# Patient Record
Sex: Male | Born: 1955 | Race: White | Hispanic: No | Marital: Single | Smoking: Current every day smoker
Health system: Southern US, Community
[De-identification: ages and names within clinical notes are randomized; demographics above are authoritative.]

## PROBLEM LIST (undated history)

## (undated) DIAGNOSIS — C641 Malignant neoplasm of right kidney, except renal pelvis: Secondary | ICD-10-CM

## (undated) DIAGNOSIS — R39198 Other difficulties with micturition: Secondary | ICD-10-CM

## (undated) DIAGNOSIS — N529 Male erectile dysfunction, unspecified: Secondary | ICD-10-CM

## (undated) DIAGNOSIS — R319 Hematuria, unspecified: Secondary | ICD-10-CM

## (undated) DIAGNOSIS — R7989 Other specified abnormal findings of blood chemistry: Secondary | ICD-10-CM

## (undated) DIAGNOSIS — I499 Cardiac arrhythmia, unspecified: Secondary | ICD-10-CM

## (undated) DIAGNOSIS — R5381 Other malaise: Secondary | ICD-10-CM

## (undated) DIAGNOSIS — N200 Calculus of kidney: Secondary | ICD-10-CM

## (undated) DIAGNOSIS — K759 Inflammatory liver disease, unspecified: Secondary | ICD-10-CM

## (undated) DIAGNOSIS — N138 Other obstructive and reflux uropathy: Secondary | ICD-10-CM

## (undated) DIAGNOSIS — N289 Disorder of kidney and ureter, unspecified: Secondary | ICD-10-CM

## (undated) DIAGNOSIS — R3912 Poor urinary stream: Secondary | ICD-10-CM

## (undated) DIAGNOSIS — G8929 Other chronic pain: Secondary | ICD-10-CM

## (undated) DIAGNOSIS — K5900 Constipation, unspecified: Secondary | ICD-10-CM

## (undated) DIAGNOSIS — F419 Anxiety disorder, unspecified: Secondary | ICD-10-CM

## (undated) DIAGNOSIS — E039 Hypothyroidism, unspecified: Secondary | ICD-10-CM

## (undated) DIAGNOSIS — M549 Dorsalgia, unspecified: Secondary | ICD-10-CM

## (undated) DIAGNOSIS — IMO0002 Reserved for concepts with insufficient information to code with codable children: Secondary | ICD-10-CM

## (undated) DIAGNOSIS — N401 Enlarged prostate with lower urinary tract symptoms: Secondary | ICD-10-CM

## (undated) DIAGNOSIS — F319 Bipolar disorder, unspecified: Secondary | ICD-10-CM

## (undated) HISTORY — DX: Hypothyroidism, unspecified: E03.9

## (undated) HISTORY — DX: Benign prostatic hyperplasia with lower urinary tract symptoms: N13.8

## (undated) HISTORY — DX: Anxiety disorder, unspecified: F41.9

## (undated) HISTORY — DX: Other specified abnormal findings of blood chemistry: R79.89

## (undated) HISTORY — DX: Dorsalgia, unspecified: M54.9

## (undated) HISTORY — DX: Constipation, unspecified: K59.00

## (undated) HISTORY — DX: Hematuria, unspecified: R31.9

## (undated) HISTORY — DX: Other malaise: R53.81

## (undated) HISTORY — PX: HERNIA REPAIR: SHX51

## (undated) HISTORY — DX: Reserved for concepts with insufficient information to code with codable children: IMO0002

## (undated) HISTORY — DX: Benign prostatic hyperplasia with lower urinary tract symptoms: N40.1

## (undated) HISTORY — PX: NEPHRECTOMY: SHX65

## (undated) HISTORY — DX: Calculus of kidney: N20.0

## (undated) HISTORY — DX: Male erectile dysfunction, unspecified: N52.9

## (undated) HISTORY — DX: Cardiac arrhythmia, unspecified: I49.9

## (undated) HISTORY — DX: Other chronic pain: G89.29

## (undated) HISTORY — DX: Bipolar disorder, unspecified: F31.9

## (undated) HISTORY — PX: TONSILLECTOMY: SUR1361

## (undated) HISTORY — DX: Other difficulties with micturition: R39.198

## (undated) HISTORY — DX: Inflammatory liver disease, unspecified: K75.9

## (undated) HISTORY — DX: Poor urinary stream: R39.12

## (undated) HISTORY — DX: Malignant neoplasm of right kidney, except renal pelvis: C64.1

---

## 2006-12-31 ENCOUNTER — Other Ambulatory Visit: Payer: Self-pay

## 2006-12-31 ENCOUNTER — Ambulatory Visit: Payer: Self-pay

## 2008-06-27 ENCOUNTER — Inpatient Hospital Stay: Payer: Self-pay | Admitting: Psychiatry

## 2008-06-28 ENCOUNTER — Ambulatory Visit: Payer: Self-pay | Admitting: Cardiology

## 2008-08-30 ENCOUNTER — Ambulatory Visit (HOSPITAL_COMMUNITY): Admission: RE | Admit: 2008-08-30 | Discharge: 2008-08-30 | Payer: Self-pay | Admitting: Unknown Physician Specialty

## 2010-01-17 ENCOUNTER — Inpatient Hospital Stay: Payer: Self-pay | Admitting: Internal Medicine

## 2010-01-18 ENCOUNTER — Inpatient Hospital Stay: Payer: Self-pay | Admitting: Psychiatry

## 2010-02-16 ENCOUNTER — Emergency Department: Payer: Self-pay | Admitting: Emergency Medicine

## 2010-02-21 ENCOUNTER — Emergency Department: Payer: Self-pay | Admitting: Emergency Medicine

## 2010-03-20 ENCOUNTER — Ambulatory Visit: Payer: Self-pay | Admitting: Urology

## 2010-04-17 ENCOUNTER — Ambulatory Visit: Payer: Self-pay | Admitting: Urology

## 2010-04-29 ENCOUNTER — Inpatient Hospital Stay: Payer: Self-pay | Admitting: Urology

## 2010-05-01 LAB — PATHOLOGY REPORT

## 2010-05-03 ENCOUNTER — Emergency Department: Payer: Self-pay | Admitting: Emergency Medicine

## 2010-09-02 ENCOUNTER — Ambulatory Visit: Payer: Self-pay | Admitting: Gastroenterology

## 2010-10-14 ENCOUNTER — Ambulatory Visit: Payer: Self-pay | Admitting: Urology

## 2010-11-12 ENCOUNTER — Inpatient Hospital Stay: Payer: Self-pay | Admitting: Psychiatry

## 2011-01-23 ENCOUNTER — Emergency Department: Payer: Self-pay | Admitting: Emergency Medicine

## 2011-05-19 ENCOUNTER — Ambulatory Visit: Payer: Self-pay | Admitting: Pain Medicine

## 2011-07-01 ENCOUNTER — Emergency Department: Payer: Self-pay | Admitting: Emergency Medicine

## 2011-07-03 ENCOUNTER — Ambulatory Visit: Payer: Self-pay | Admitting: Sports Medicine

## 2011-07-25 ENCOUNTER — Ambulatory Visit: Payer: Self-pay | Admitting: Urology

## 2011-09-18 ENCOUNTER — Emergency Department: Payer: Self-pay | Admitting: Emergency Medicine

## 2011-09-18 LAB — DRUG SCREEN, URINE
Amphetamines, Ur Screen: NEGATIVE (ref ?–1000)
Barbiturates, Ur Screen: NEGATIVE (ref ?–200)
Cannabinoid 50 Ng, Ur ~~LOC~~: NEGATIVE (ref ?–50)
Opiate, Ur Screen: NEGATIVE (ref ?–300)
Phencyclidine (PCP) Ur S: NEGATIVE (ref ?–25)

## 2011-09-18 LAB — COMPREHENSIVE METABOLIC PANEL
Albumin: 3.9 g/dL (ref 3.4–5.0)
Alkaline Phosphatase: 93 U/L (ref 50–136)
BUN: 18 mg/dL (ref 7–18)
Bilirubin,Total: 0.7 mg/dL (ref 0.2–1.0)
Calcium, Total: 8.9 mg/dL (ref 8.5–10.1)
Chloride: 107 mmol/L (ref 98–107)
Co2: 32 mmol/L (ref 21–32)
Glucose: 93 mg/dL (ref 65–99)
Osmolality: 283 (ref 275–301)
SGOT(AST): 24 U/L (ref 15–37)
SGPT (ALT): 40 U/L
Sodium: 141 mmol/L (ref 136–145)
Total Protein: 7.4 g/dL (ref 6.4–8.2)

## 2011-09-18 LAB — URINALYSIS, COMPLETE
Bacteria: NONE SEEN
Glucose,UR: NEGATIVE mg/dL (ref 0–75)
Nitrite: NEGATIVE
Ph: 7 (ref 4.5–8.0)
Protein: NEGATIVE
RBC,UR: NONE SEEN /HPF (ref 0–5)
Specific Gravity: 1.005 (ref 1.003–1.030)
Squamous Epithelial: NONE SEEN

## 2011-09-18 LAB — CBC
HCT: 45 % (ref 40.0–52.0)
MCH: 30.1 pg (ref 26.0–34.0)
MCHC: 32.6 g/dL (ref 32.0–36.0)
RBC: 4.87 10*6/uL (ref 4.40–5.90)
RDW: 14.6 % — ABNORMAL HIGH (ref 11.5–14.5)

## 2011-09-18 LAB — TSH: Thyroid Stimulating Horm: 1.23 u[IU]/mL

## 2013-11-24 ENCOUNTER — Ambulatory Visit: Payer: Self-pay | Admitting: Obstetrics and Gynecology

## 2013-12-01 ENCOUNTER — Ambulatory Visit: Payer: Self-pay | Admitting: Obstetrics and Gynecology

## 2013-12-08 ENCOUNTER — Ambulatory Visit: Payer: Self-pay | Admitting: Urology

## 2013-12-08 LAB — CBC
HCT: 47.5 % (ref 40.0–52.0)
HGB: 15.8 g/dL (ref 13.0–18.0)
MCH: 29 pg (ref 26.0–34.0)
MCHC: 33.2 g/dL (ref 32.0–36.0)
MCV: 87 fL (ref 80–100)
Platelet: 244 10*3/uL (ref 150–440)
RBC: 5.45 10*6/uL (ref 4.40–5.90)
RDW: 14.5 % (ref 11.5–14.5)
WBC: 10.1 10*3/uL (ref 3.8–10.6)

## 2013-12-08 LAB — BASIC METABOLIC PANEL
BUN: 20 mg/dL — AB (ref 7–18)
CALCIUM: 9 mg/dL (ref 8.5–10.1)
CHLORIDE: 107 mmol/L (ref 98–107)
CO2: 27 mmol/L (ref 21–32)
Creatinine: 1.8 mg/dL — ABNORMAL HIGH (ref 0.60–1.30)
EGFR (Non-African Amer.): 41 — ABNORMAL LOW
GFR CALC AF AMER: 47 — AB
Glucose: 105 mg/dL — ABNORMAL HIGH (ref 65–99)
Osmolality: 267 (ref 275–301)
Potassium: 4.8 mmol/L (ref 3.5–5.1)
Sodium: 132 mmol/L — ABNORMAL LOW (ref 136–145)

## 2013-12-14 ENCOUNTER — Ambulatory Visit: Payer: Self-pay | Admitting: Urology

## 2014-01-20 ENCOUNTER — Ambulatory Visit: Payer: Self-pay | Admitting: Urology

## 2014-02-22 DIAGNOSIS — R251 Tremor, unspecified: Secondary | ICD-10-CM | POA: Insufficient documentation

## 2014-02-22 DIAGNOSIS — G251 Drug-induced tremor: Secondary | ICD-10-CM | POA: Insufficient documentation

## 2014-05-01 DIAGNOSIS — G479 Sleep disorder, unspecified: Secondary | ICD-10-CM | POA: Insufficient documentation

## 2014-07-15 NOTE — Op Note (Signed)
PATIENT NAME:  Zachary Reynolds, Zachary Reynolds MR#:  [id number] DATE OF BIRTH:  Apr 28, 1955  DATE OF PROCEDURE:  12/14/2013  PREOPERATIVE DIAGNOSIS: Left distal ureteral stone, solitary kidney.   POSTOPERATIVE DIAGNOSIS: Negative ureteroscopy (passed ureteral stone), solitary left kidney.   PROCEDURE PERFORMED: Left retrograde pyelogram, left ureteroscopy, left ureteral stent placement.   INTERPRETATION OF FLUOROSCOPY:  Less than 30 minutes.   ATTENDING SURGEON:  Sherlynn Stalls, MD   ANESTHESIA: General anesthesia.   ESTIMATED BLOOD LOSS: Was minimal.   DRAINS:  A 6 x 26-French double-J ureteral stent on the left. No string.   COMPLICATIONS: None.   INDICATION:  This is a 59 year old male with a solitary left kidney, found to have a 4.8-mm left UVJ stone, which is partially obstructing. He was counseled on his various treatment options and has elected to undergo ureteroscopy, laser lithotripsy, and stent placement. Of note, he did indicate in the preoperative area that he continues to have left lower quadrant pain consistent with a retained stone. Risks and benefits of the procedure explained in detail.  The patient agreed to proceed as planned.   DESCRIPTION OF PROCEDURE:  The patient was correctly identified in the preoperative holding area and informed consent was confirmed. He was brought to the operating suite and placed on the table in the supine position. At this time, a universal timeout protocol was performed. All team members were identified. Venodyne boots were placed, and the patient was administered ciprofloxacin 400 mg in the perioperative period. He was then placed under general anesthesia, repositioned lower on the bed in the dorsal lithotomy position, and prepped and draped in the standard surgical fashion. At this point, a rigid cystoscope was placed per urethra into the bladder using a 22-French access sheath. Attention was turned to the left ureteral orifice, which was cannulated using a  Sensor wire as well as a 5-French open-ended ureteral catheter just within the UO and the wire was removed. At this point, a retrograde pyelogram was performed revealing no evidence of filling defect, a delicate-appearing ureter up to the level of the UPJ without evidence of filling defect or hydronephrosis. Prior to the retrograde, no stone shadow could be seen. Given his continued pain, the decision was made to go ahead and proceed with  ureteroscopy to confirm that he had indeed passed a stone. A sensor wire was then advanced up to the level of the kidney without difficulty. This was snapped in place with a safety wire. The cystoscope was then exchanged for a short rigid ureteroscope, which was able to be passed beyond the left UO without difficulty. The distal ureter at the level of the UVJ did appear somewhat ragged and slightly edematous, consistent with a previously passed stone. Beyond the intermural ureter, there was normal-appearing urothelium. The scope was able to be passed all the way up to the mid-ureter past the ASIS without difficulty. There was absolutely no evidence of a stone within this entire segment of ureter, as such, the scope was removed. The safety wire was then backloaded over the cystoscope and a 6 x 26-French double-J ureteral stent was placed over the wire to the level of the renal pelvis. The wire was partially withdrawn.  An adequate coil was noted within the kidney. The wire was then fully withdrawn and a coil was noted within the bladder. The bladder was then drained. No string was left on the stent given his history of a solitary kidney. The patient was then repositioned in the supine position,  reversed from anesthesia, and taken to the [facility] unit in stable condition.  PLAN:  He will follow up next week in the office for a cystoscopy and stent removal.    ____________________________ Sherlynn Stalls, MD ajb:lr D: 12/14/2013 15:44:36 ET T: 12/14/2013 19:56:22  ET JOB#: [id number]  cc: Sherlynn Stalls, MD, <Dictator> Sherlynn Stalls MD ELECTRONICALLY SIGNED 01/01/2014 8:48

## 2014-07-16 NOTE — Consult Note (Signed)
PATIENT NAME:  Zachary Reynolds, Zachary Reynolds MR#:  [id number] DATE OF BIRTH:  06-05-1955  DATE OF CONSULTATION:  09/19/2011  REFERRING PHYSICIAN:   CONSULTING PHYSICIAN:  Gonzella Lex, MD  IDENTIFYING INFORMATION AND REASON FOR CONSULT: This is a 59 year old man in the Emergency Room who earlier today made statements taken to indicate suicidality. Evaluation for risk of suicide and need for psychiatric treatment.   HISTORY OF PRESENT ILLNESS: The patient states that his chronic pain which usually he is able to cope with has been worse recently. He thinks that it happened when he got into a truck and drove too long. In any case, for the last couple of days his pain has been much worse. He felt like it has been so agonizing that his usual means of dealing with it are not helping. He called his primary care doctor who told him just to take his Neurontin and Tylenol. The patient felt this was inadequate and came to the Emergency Room wanting to get narcotic pain medicine. He was treated with Ultram which he does not find effective. When he was told that he would not be getting anymore narcotic pain medicine, he became agitated and stated that he was hurting so bad that he was liable to go home and kill himself. He was put under IVC for evaluation. Several hours later now I am talking with the patient and he states that he has no suicidal ideation whatsoever. He tells me that what he said was a "metaphor" and an exaggeration because his pain was so bad. He describes to me that he has chronic pain problems that sometimes flare up and when they do they can become overwhelming to him. He says that his mood is chronically anxious but that it has been managed adequately by his outpatient psychiatrist. He is not having worsening depression. He says that he has not been abusing other drugs. Denies any suicidal intent or plan. He says that he plans to go home, enjoy his life, stay with his family. Has positive things to live for.  Understands that his pain will get better and feels optimistic about it. Understands he has other means to deal with his problems than harming himself. He is currently taking Zoloft daily as well as Xanax 2 mg 3 times a day from his primary care doctor and has medication available at home.   PAST PSYCHIATRIC HISTORY: The patient has had admissions to the hospital under similar circumstances in the past. His chronic pain interacts with his anxiety and he becomes agitated. 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 So far he has managed to continue to get benzodiazepines in the community, although it seems like they have pretty much gotten him off of narcotics. He does have a past history of making some suicide attempts but has not done so recently. He has a history of threatening suicide in the past when he is upset but has not recently carried through on it. Diagnosis would be for me generalized anxiety disorder or anxiety disorder, not otherwise specified, as well as his chronic pain.   PAST MEDICAL HISTORY: Chronic back pain which often shoots down his legs as well.   SOCIAL HISTORY: The patient lives by himself but his parents are across the street and are his primary contact and support.  FAMILY HISTORY: No family history identified.   SUBSTANCE ABUSE HISTORY: As far as I can tell from what he has said, he has not had a problem with alcohol abuse in the past, has not apparently had a problem with abusing other drugs other than possibly his prescription narcotics and benzodiazepines.   CURRENT MEDICATIONS ACCORDING TO HIM: 1. Zoloft, unknown dose, per day.  2. Xanax 2 mg 3 times a day. 3. Synthroid 88 mcg per day. 4. Halcion 0.25 mg at night. 5. Omeprazole per day.  6. Neurontin 600 mg 3 times a day.   ALLERGIES: He says he is  allergic to steroids but what he means is they make him feel jittery.   REVIEW OF SYSTEMS: Complains of continued back pain worse than usual. Difficulty walking. No other physical symptoms. Does feel a little bit tense and jittery. Mood is a little bit frustrated. Denies suicidal ideation. Denied psychotic symptoms.   MENTAL STATUS EXAMINATION: Neatly dressed and groomed man. Cooperative with the exam. Good eye contact, normal psychomotor activity. Speech normal rate, tone, and volume. Affect euthymic, reactive and appropriate. Mood stated as being okay. Thoughts are lucid with no evidence of loosening of associations. Denies suicidal or homicidal ideation completely. Denies hallucinations. Judgment and insight currently seem adequate.   PHYSICAL EXAMINATION: Full physical is not done for consult. I note that his current vital signs show a blood pressure of 136/100 but it was lower earlier in the hospital stay, respirations 18, pulse 75, temperature 97.3. He does walk stiffly and uses a cane.   LABORATORY DATA: His drug screen is positive for benzodiazepines which matches the medicine he takes. Alcohol undetectable. Slightly elevated creatinine. No other chemistry abnormalities. Hematology panel normal.   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 He came into the hospital saying his pain was much worse and seeking narcotics. He made statements earlier in the day that were taken as indicating suicidal ideation. The patient now tells me that he was overreacting and never had any suicidal thoughts. He is not reporting acute depression. He is calm and lucid and able to articulate a reasonable plan for the future and things about his life that he enjoys. Although he does have a past history of suicide attempts, currently he appears to be stable. I do not think he is meeting  commitment criteria right now nor does he need inpatient psychiatric hospitalization. He already has outpatient psychiatric treatment in place.   TREATMENT PLAN:  1. I've discontinued the involuntary commitment paperwork.  2. I spoke with the Emergency Room, Dr. Lovena Le, who is agreeable to my suggestion that the patient be discharged from the Emergency Room.   3. He has follow-up with [name] for psychiatry in the community.   DIAGNOSES PRINCIPLE AND PRIMARY:  AXIS I: Anxiety disorder, not otherwise specified.   SECONDARY DIAGNOSES:  AXIS I: No further.   AXIS II: Deferred.   AXIS III: Chronic back pain.   AXIS IV: Moderate to severe chronic stress from his back pain.   AXIS V: Functioning at time of evaluation 60.   ____________________________ Gonzella Lex, MD jtc:drc D: 09/19/2011 14:00:03 ET T: 09/19/2011 14:10:53 ET JOB#: [id number] Gonzella Lex MD ELECTRONICALLY SIGNED 09/20/2011 23:18

## 2014-07-16 NOTE — Consult Note (Signed)
Brief Consult Note: Diagnosis: anxiety do nos.   Patient was seen by consultant.   Consult note dictated.   Discussed with Attending MD.   Comments: Psychiatry: Patient seen. Chronic pain and anxiety. Now calm and denies any suicidal ideation. Admits he was exagerating due to pain. Has outpt psych tx and a safe place to say. Spoke w Dr Lovena Le. DC IVF advise discharge.  Electronic Signatures: Gonzella Lex (MD)  (Signed 28-Jun-13 13:51)  Authored: Brief Consult Note   Last Updated: 28-Jun-13 13:51 by Gonzella Lex (MD)

## 2014-08-31 ENCOUNTER — Emergency Department
Admission: EM | Admit: 2014-08-31 | Discharge: 2014-08-31 | Disposition: A | Discharge status: Home / Self Care | Payer: Medicare Other | Attending: Student | Admitting: Student

## 2014-08-31 DIAGNOSIS — M5442 Lumbago with sciatica, left side: Secondary | ICD-10-CM | POA: Diagnosis not present

## 2014-08-31 DIAGNOSIS — M545 Low back pain: Secondary | ICD-10-CM | POA: Diagnosis present

## 2014-08-31 DIAGNOSIS — G8929 Other chronic pain: Secondary | ICD-10-CM | POA: Diagnosis not present

## 2014-08-31 DIAGNOSIS — R200 Anesthesia of skin: Secondary | ICD-10-CM | POA: Insufficient documentation

## 2014-08-31 DIAGNOSIS — M5432 Sciatica, left side: Secondary | ICD-10-CM

## 2014-08-31 MED ORDER — METHOCARBAMOL 500 MG PO TABS
ORAL_TABLET | ORAL | Status: AC
Start: 2014-08-31 — End: 2014-08-31
  Administered 2014-08-31: 500 mg via ORAL
  Filled 2014-08-31: qty 1

## 2014-08-31 MED ORDER — TIZANIDINE HCL 4 MG PO TABS: 4.0000 mg | ORAL_TABLET | Freq: Four times a day (QID) | ORAL | Status: DC | PRN

## 2014-08-31 MED ORDER — METHYLPREDNISOLONE SODIUM SUCC 125 MG IJ SOLR
INTRAMUSCULAR | Status: AC
  Administered 2014-08-31: 60 mg via INTRAMUSCULAR
  Filled 2014-08-31: qty 2

## 2014-08-31 MED ORDER — METHYLPREDNISOLONE SODIUM SUCC 125 MG IJ SOLR
60.0000 mg | Freq: Once | INTRAMUSCULAR | Status: AC
  Administered 2014-08-31: 60 mg via INTRAMUSCULAR

## 2014-08-31 MED ORDER — METHOCARBAMOL 500 MG PO TABS
500.0000 mg | ORAL_TABLET | Freq: Once | ORAL | Status: AC
  Administered 2014-08-31: 500 mg via ORAL

## 2014-08-31 NOTE — Discharge Instructions (Signed)
Sciatica °Sciatica is pain, weakness, numbness, or tingling along the path of the sciatic nerve. The nerve starts in the lower back and runs down the back of each leg. The nerve controls the muscles in the lower leg and in the back of the knee, while also providing sensation to the back of the thigh, lower leg, and the sole of your foot. Sciatica is a symptom of another medical condition. For instance, nerve damage or certain conditions, such as a herniated disk or bone spur on the spine, pinch or put pressure on the sciatic nerve. This causes the pain, weakness, or other sensations normally associated with sciatica. Generally, sciatica only affects one side of the body. °CAUSES  °· Herniated or slipped disc. °· Degenerative disk disease. °· A pain disorder involving the narrow muscle in the buttocks (piriformis syndrome). °· Pelvic injury or fracture. °· Pregnancy. °· Tumor (rare). °SYMPTOMS  °Symptoms can vary from mild to very severe. The symptoms usually travel from the low back to the buttocks and down the back of the leg. Symptoms can include: °· Mild tingling or dull aches in the lower back, leg, or hip. °· Numbness in the back of the calf or sole of the foot. °· Burning sensations in the lower back, leg, or hip. °· Sharp pains in the lower back, leg, or hip. °· Leg weakness. °· Severe back pain inhibiting movement. °These symptoms may get worse with coughing, sneezing, laughing, or prolonged sitting or standing. Also, being overweight may worsen symptoms. °DIAGNOSIS  °Your caregiver will perform a physical exam to look for common symptoms of sciatica. He or she may ask you to do certain movements or activities that would trigger sciatic nerve pain. Other tests may be performed to find the cause of the sciatica. These may include: °· Blood tests. °· X-rays. °· Imaging tests, such as an MRI or CT scan. °TREATMENT  °Treatment is directed at the cause of the sciatic pain. Sometimes, treatment is not necessary  and the pain and discomfort goes away on its own. If treatment is needed, your caregiver may suggest: °· Over-the-counter medicines to relieve pain. °· Prescription medicines, such as anti-inflammatory medicine, muscle relaxants, or narcotics. °· Applying heat or ice to the painful area. °· Steroid injections to lessen pain, irritation, and inflammation around the nerve. °· Reducing activity during periods of pain. °· Exercising and stretching to strengthen your abdomen and improve flexibility of your spine. Your caregiver may suggest losing weight if the extra weight makes the back pain worse. °· Physical therapy. °· Surgery to eliminate what is pressing or pinching the nerve, such as a bone spur or part of a herniated disk. °HOME CARE INSTRUCTIONS  °· Only take over-the-counter or prescription medicines for pain or discomfort as directed by your caregiver. °· Apply ice to the affected area for 20 minutes, 3-4 times a day for the first 48-72 hours. Then try heat in the same way. °· Exercise, stretch, or perform your usual activities if these do not aggravate your pain. °· Attend physical therapy sessions as directed by your caregiver. °· Keep all follow-up appointments as directed by your caregiver. °· Do not wear high heels or shoes that do not provide proper support. °· Check your mattress to see if it is too soft. A firm mattress may lessen your pain and discomfort. °SEEK IMMEDIATE MEDICAL CARE IF:  °· You lose control of your bowel or bladder (incontinence). °· You have increasing weakness in the lower back, pelvis, buttocks,   or legs.  You have redness or swelling of your back.  You have a burning sensation when you urinate.  You have pain that gets worse when you lie down or awakens you at night.  Your pain is worse than you have experienced in the past.  Your pain is lasting longer than 4 weeks.  You are suddenly losing weight without reason. MAKE SURE YOU:  Understand these  instructions.  Will watch your condition.  Will get help right away if you are not doing well or get worse. Document Released: 03/04/2001 Document Revised: 09/09/2011 Document Reviewed: 07/20/2011 [facility] Patient Information 2015 [place], [place]. This information is not intended to replace advice given to you by your health care provider. Make sure you discuss any questions you have with your health care provider.     Back Pain, Adult Back pain is very common. The pain often gets better over time. The cause of back pain is usually not dangerous. Most people can learn to manage their back pain on their own.  HOME CARE   Stay active. Start with short walks on flat ground if you can. Try to walk farther each day.  Do not sit, drive, or stand in one place for more than 30 minutes. Do not stay in bed.  Do not avoid exercise or work. Activity can help your back heal faster.  Be careful when you bend or lift an object. Bend at your knees, keep the object close to you, and do not twist.  Sleep on a firm mattress. Lie on your side, and bend your knees. If you lie on your back, put a pillow under your knees.  Only take medicines as told by your doctor.  Put ice on the injured area.  Put ice in a plastic bag.  Place a towel between your skin and the bag.  Leave the ice on for 15-20 minutes, 03-04 times a day for the first 2 to 3 days. After that, you can switch between ice and heat packs.  Ask your doctor about back exercises or massage.  Avoid feeling anxious or stressed. Find good ways to deal with stress, such as exercise. GET HELP RIGHT AWAY IF:   Your pain does not go away with rest or medicine.  Your pain does not go away in 1 week.  You have new problems.  You do not feel well.  The pain spreads into your legs.  You cannot control when you poop (bowel movement) or pee (urinate).  Your arms or legs feel weak or lose feeling (numbness).  You feel sick to your stomach  (nauseous) or throw up (vomit).  You have belly (abdominal) pain.  You feel like you may pass out (faint). MAKE SURE YOU:   Understand these instructions.  Will watch your condition.  Will get help right away if you are not doing well or get worse. Document Released: 08/27/2007 Document Revised: 06/02/2011 Document Reviewed: 07/12/2013 [facility] Patient Information 2015 [place], [place]. This information is not intended to replace advice given to you by your health care provider. Make sure you discuss any questions you have with your health care provider.  Take muscle relaxants as directed for pain relief. Continue oxycodone as needed. Contact your physician for possible referral to a back specialist if not improving.

## 2014-08-31 NOTE — ED Notes (Signed)
Patient reports that he has neuropathy and that today the pain became worse. Patient states that he has numbness to his left foot and cramping to left hip and leg. Patient denies any injury.

## 2014-08-31 NOTE — ED Provider Notes (Signed)
[facility] Emergency Department Provider Note  ____________________________________________  Time seen: Approximately 8:16 PM  I have reviewed the triage vital signs and the nursing notes.   HISTORY  Chief Complaint Leg Pain    HPI Zachary Reynolds is a 59 y.o. male with history of chronic back pain and degenerative disc disease who presents to the ER with worsening pain in the left buttocks and radiation down to the left foot. He has numbness into the leg. Pain is worse with walking. He is able to move his lower extremity. His bowels have been sluggish. He is on chronic oxycodone 10 mg 4 times a day. He is seen by the pain clinic. He also seen by psychiatry and takes Xanax regularly.   No past medical history on file.  There are no active problems to display for this patient.   No past surgical history on file.  Current Outpatient Rx  Name  Route  Sig  Dispense  Refill  . tiZANidine (ZANAFLEX) 4 MG tablet   Oral   Take 1 tablet (4 mg total) by mouth every 6 (six) hours as needed for muscle spasms.   30 tablet   0     Allergies Prednisone  No family history on file.  Social History History  Substance Use Topics  . Smoking status: Not on file  . Smokeless tobacco: Not on file  . Alcohol Use: Not on file    Review of Systems Constitutional: No fever/chills Cardiovascular: Denies chest pain. Respiratory: Denies shortness of breath. Gastrointestinal: No abdominal pain.  No nausea, no vomiting. Has been constipated. Genitourinary: Negative for dysuria. Musculoskeletal: see above Skin: Negative for rash. Neurological: Negative for headaches, focal weakness or numbness.  10-point ROS otherwise negative.  ____________________________________________   PHYSICAL EXAM:  VITAL SIGNS: ED Triage Vitals  Enc Vitals Group     BP 08/31/14 1914 151/96 mmHg     Pulse Rate 08/31/14 1914 75     Resp 08/31/14 1914 18     Temp 08/31/14 1914 98.6  F (37 C)     Temp Source 08/31/14 1914 Oral     SpO2 08/31/14 1914 96 %     Weight 08/31/14 1914 208 lb (94.348 kg)     Height 08/31/14 1914 6\' 2"  (1.88 m)     Head Cir --      Peak Flow --      Pain Score 08/31/14 1914 8     Pain Loc --      Pain Edu? --      Excl. in [place]? --     Constitutional: Alert and oriented. Well appearing and in no acute distress. Eyes: Conjunctivae are normal. PERRL. EOMI. Head: Atraumatic. Nose: No congestion/rhinnorhea. Mouth/Throat: Mucous membranes are moist.  Oropharynx non-erythematous. Neck: No stridor.   Cardiovascular: Normal rate, regular rhythm. Grossly normal heart sounds.  Good peripheral circulation. Respiratory: Normal respiratory effort.  No retractions. Lungs CTAB. Gastrointestinal: Soft and nontender. No distention. No abdominal bruits. No CVA tenderness. Musculoskeletal: No lower extremity tenderness nor edema.  No joint effusions. 2+ DP pulse on the left.    tender over the lumbar spine and paraspinal muscles.  rom intact of left hip.  nontender over the greater trochanter bilateral. Neurologic:  Normal speech and language. No gross focal neurologic deficits are appreciated. Speech is normal. No gait instability. Skin:  Skin is warm, dry and intact. No rash noted. Psychiatric: Mood and affect are normal. Speech and behavior are normal.  ____________________________________________  LABS (all labs ordered are listed, but only abnormal results are displayed)  Labs Reviewed - No data to display ____________________________________________  EKG   ____________________________________________  RADIOLOGY  Offered xray of lumbar spine.  Hx of MRI with known degenerative disc dz.  No new acute injury. ____________________________________________   PROCEDURES  Procedure(s) performed: None  Critical Care performed: No  ____________________________________________   INITIAL IMPRESSION / ASSESSMENT AND PLAN / ED  COURSE  Pertinent labs & imaging results that were available during my care of the patient were reviewed by me and considered in my medical decision making (see chart for details).  Low back pain with left sciatica. History of degenerative disc disease. Seen by a pain clinic. Takes oxycodone 10 mg 4 times a day. He is given Solu-Medrol 60 mg IM in the ED. Also Robaxin 500 mg 1. He can continue a muscle relaxant, tizanidine as needed. Encouraged him to contact his primary physician or the pain clinic if pain is not improving. ____________________________________________   FINAL CLINICAL IMPRESSION(S) / ED DIAGNOSES  Final diagnoses:  Sciatica, left      Mortimer Fries, PA-C 08/31/14 2024  Joanne Gavel, MD 09/01/14 0003

## 2014-11-24 ENCOUNTER — Emergency Department
Admission: EM | Admit: 2014-11-24 | Discharge: 2014-11-24 | Disposition: A | Discharge status: Home / Self Care | Payer: Medicare Other | Attending: Student | Admitting: Student

## 2014-11-24 ENCOUNTER — Encounter: Payer: Self-pay | Admitting: Emergency Medicine

## 2014-11-24 DIAGNOSIS — G8929 Other chronic pain: Secondary | ICD-10-CM | POA: Diagnosis not present

## 2014-11-24 DIAGNOSIS — Z720 Tobacco use: Secondary | ICD-10-CM | POA: Diagnosis not present

## 2014-11-24 DIAGNOSIS — Z87442 Personal history of urinary calculi: Secondary | ICD-10-CM | POA: Diagnosis not present

## 2014-11-24 DIAGNOSIS — M546 Pain in thoracic spine: Secondary | ICD-10-CM | POA: Insufficient documentation

## 2014-11-24 DIAGNOSIS — M549 Dorsalgia, unspecified: Secondary | ICD-10-CM

## 2014-11-24 DIAGNOSIS — N390 Urinary tract infection, site not specified: Secondary | ICD-10-CM | POA: Diagnosis not present

## 2014-11-24 DIAGNOSIS — Z79899 Other long term (current) drug therapy: Secondary | ICD-10-CM | POA: Diagnosis not present

## 2014-11-24 DIAGNOSIS — M545 Low back pain: Secondary | ICD-10-CM | POA: Insufficient documentation

## 2014-11-24 HISTORY — DX: Disorder of kidney and ureter, unspecified: N28.9

## 2014-11-24 LAB — URINALYSIS COMPLETE WITH MICROSCOPIC (ARMC ONLY)
BACTERIA UA: NONE SEEN
Bilirubin Urine: NEGATIVE
Glucose, UA: NEGATIVE mg/dL
HGB URINE DIPSTICK: NEGATIVE
Ketones, ur: NEGATIVE mg/dL
Nitrite: NEGATIVE
PH: 5 (ref 5.0–8.0)
PROTEIN: NEGATIVE mg/dL
SPECIFIC GRAVITY, URINE: 1.021 (ref 1.005–1.030)
SQUAMOUS EPITHELIAL / LPF: NONE SEEN

## 2014-11-24 MED ORDER — SULFAMETHOXAZOLE-TRIMETHOPRIM 800-160 MG PO TABS: 1.0000 | ORAL_TABLET | Freq: Two times a day (BID) | ORAL | Status: DC

## 2014-11-24 NOTE — ED Notes (Signed)
Patient to ED with report of "excrutiating" middle back pain. Patient reports he has already taken 2 Oxycodone today.

## 2014-11-24 NOTE — ED Provider Notes (Signed)
[facility] Emergency Department Provider Note  ____________________________________________  Time seen: Approximately 1:40 PM  I have reviewed the triage vital signs and the nursing notes.   HISTORY  Chief Complaint Back Pain   HPI FINES Zachary Reynolds is a 59 y.o. male is here today with complaint of "excruciating" middle back pain. He states this started yesterday and today has only taken 2 oxycodone without relief. He states he does have a history of urinary tract infections and also kidney stones.He has not seen any hematuria. He denies any fever or chills, nausea or vomiting. He denies any dysuria. He denies any paresthesias in his lower extremities. He denies any difficulty walking. Currently states his pain is 8 out of 10. He states that his pain clinic doctor is in [place] and currently he is taking 7 oxycodone a day. Patient did drive himself to the emergency room today.   Past Medical History  Diagnosis Date  . Renal disorder     There are no active problems to display for this patient.   History reviewed. No pertinent past surgical history.  Current Outpatient Rx  Name  Route  Sig  Dispense  Refill  . ALPRAZolam (XANAX) 0.25 MG tablet   Oral   Take 0.25 mg by mouth 3 (three) times daily.         Marland Kitchen sulfamethoxazole-trimethoprim (BACTRIM DS,SEPTRA DS) 800-160 MG per tablet   Oral   Take 1 tablet by mouth 2 (two) times daily.   20 tablet   0   . tiZANidine (ZANAFLEX) 4 MG tablet   Oral   Take 1 tablet (4 mg total) by mouth every 6 (six) hours as needed for muscle spasms.   30 tablet   0     Allergies Prednisone  History reviewed. No pertinent family history.  Social History Social History  Substance Use Topics  . Smoking status: Current Every Day Smoker  . Smokeless tobacco: None  . Alcohol Use: Yes    Review of Systems Constitutional: No fever/chills Cardiovascular: Denies chest pain. Respiratory: Denies shortness of  breath. Gastrointestinal: No abdominal pain.  No nausea, no vomiting.  No diarrhea.  No constipation. Genitourinary: Negative for dysuria. History of UTI and stones Musculoskeletal: Positive for back pain. Skin: Negative for rash. Neurological: Negative for headaches, focal weakness or numbness.  10-point ROS otherwise negative.  ____________________________________________   PHYSICAL EXAM:  VITAL SIGNS: ED Triage Vitals  Enc Vitals Group     BP 11/24/14 1310 154/96 mmHg     Pulse Rate 11/24/14 1310 84     Resp 11/24/14 1310 18     Temp 11/24/14 1310 98.4 F (36.9 C)     Temp Source 11/24/14 1310 Oral     SpO2 11/24/14 1310 99 %     Weight 11/24/14 1310 212 lb (96.163 kg)     Height 11/24/14 1310 6\' 2"  (1.88 m)     Head Cir --      Peak Flow --      Pain Score 11/24/14 1311 8     Pain Loc --      Pain Edu? --      Excl. in [place]? --     Constitutional: Alert and oriented. Well appearing and in no acute distress. Eyes: Conjunctivae are normal. PERRL. EOMI. Head: Atraumatic. Nose: No congestion/rhinnorhea. Neck: No stridor.   Cardiovascular: Normal rate, regular rhythm. Grossly normal heart sounds.  Good peripheral circulation. Respiratory: Normal respiratory effort.  No retractions. Lungs CTAB. Gastrointestinal:  Soft and nontender. No distention. No abdominal bruits. No CVA tenderness. Musculoskeletal: Examination of the back shows no gross abnormality. There is tenderness on palpation generalized soft tissue lumbar area paravertebral muscles. There is no acute muscle spasm seen on range of motion. No lower extremity tenderness nor edema.  No joint effusions. Neurologic:  Normal speech and language. No gross focal neurologic deficits are appreciated. No gait instability. Skin:  Skin is warm, dry and intact. No rash noted. Psychiatric: Mood and affect are normal. Speech and behavior are normal.  ____________________________________________   LABS (all labs ordered are  listed, but only abnormal results are displayed)  Labs Reviewed  URINALYSIS COMPLETEWITH MICROSCOPIC ([place]) - Abnormal; Notable for the following:    Color, Urine YELLOW (*)    APPearance CLEAR (*)    Leukocytes, UA 2+ (*)    All other components within normal limits  URINE CULTURE     PROCEDURES  Procedure(s) performed: None  Critical Care performed: No  ____________________________________________   INITIAL IMPRESSION / ASSESSMENT AND PLAN / ED COURSE  Pertinent labs & imaging results that were available during my care of the patient were reviewed by me and considered in my medical decision making (see chart for details).  Patient was started on Septra DS for infection for 10 days. He is also to continue his normal dose of oxycodone. We discussed his follow-up with his regular urologist and also to contact his pain management clinic if any continued back pain. He is to return to the emergency room if any fever, chills or urgent concerns over the weekend. ____________________________________________   FINAL CLINICAL IMPRESSION(S) / ED DIAGNOSES  Final diagnoses:  Urinary tract infection, acute  Chronic back pain greater than 3 months duration      Johnn Hai, PA-C 11/24/14 1437  Joanne Gavel, MD 11/24/14 1524

## 2014-11-26 LAB — URINE CULTURE
CULTURE: NO GROWTH
SPECIAL REQUESTS: NORMAL

## 2014-11-28 ENCOUNTER — Ambulatory Visit (INDEPENDENT_AMBULATORY_CARE_PROVIDER_SITE_OTHER): Payer: Medicare Other | Admitting: Licensed Clinical Social Worker

## 2014-11-28 DIAGNOSIS — F332 Major depressive disorder, recurrent severe without psychotic features: Secondary | ICD-10-CM | POA: Diagnosis not present
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
## 2014-12-06 ENCOUNTER — Telehealth: Payer: Self-pay | Admitting: *Deleted

## 2014-12-06 NOTE — Telephone Encounter (Signed)
Patient called to tell dr Erlene Quan that he was seen in the ER for a urinary tract infection and was given an antibiotic. Patient states his urine is still foul smelling and wants to bring in a specimen. Patient is slurring words and difficult to understand. I let him know Dr. Erlene Quan was on leave until November, but I think he needs to come in for an office visit since he has not been seen since last November. Patient states needs appt with a MD and not a PA. Patient was sent to [facility] to book his appointment.

## 2014-12-07 ENCOUNTER — Ambulatory Visit (INDEPENDENT_AMBULATORY_CARE_PROVIDER_SITE_OTHER): Payer: Medicare Other | Admitting: Urology

## 2014-12-07 ENCOUNTER — Encounter: Payer: Self-pay | Admitting: Urology

## 2014-12-07 VITALS — BP 119/79 | HR 76 | Ht 74.0 in | Wt 214.5 lb

## 2014-12-07 DIAGNOSIS — R8290 Unspecified abnormal findings in urine: Secondary | ICD-10-CM | POA: Diagnosis not present

## 2014-12-07 DIAGNOSIS — Z87442 Personal history of urinary calculi: Secondary | ICD-10-CM

## 2014-12-07 DIAGNOSIS — N400 Enlarged prostate without lower urinary tract symptoms: Secondary | ICD-10-CM

## 2014-12-07 LAB — URINALYSIS, COMPLETE
BILIRUBIN UA: NEGATIVE
Glucose, UA: NEGATIVE
Ketones, UA: NEGATIVE
Nitrite, UA: NEGATIVE
PH UA: 5.5 (ref 5.0–7.5)
PROTEIN UA: NEGATIVE
Urobilinogen, Ur: 0.2 mg/dL (ref 0.2–1.0)

## 2014-12-07 LAB — MICROSCOPIC EXAMINATION
BACTERIA UA: NONE SEEN
Epithelial Cells (non renal): NONE SEEN /hpf (ref 0–10)
RBC, UA: NONE SEEN /hpf (ref 0–?)

## 2014-12-07 NOTE — Progress Notes (Signed)
12/07/2014 2:40 PM   Zachary Reynolds 1955-10-04 [id number]  Referring provider: No referring provider defined for this encounter.  Chief Complaint  Patient presents with  . Strong Urine Odor    HPI: Major complaint is odor to his urine which I think is probably due to one of his psychiatric meds although this he is very happy with the cervical os E Alis antipsychotic (had that he is able to sleep the night without having insomnia he does have some problem with his urination and some hesitancy which I think is probably due to his oxycodone and cervical combination   PMH: Past Medical History  Diagnosis Date  . Renal disorder   . Slow urinary stream   . Hepatitis   . Constipation   . Cancer of right kidney   . Bipolar depression   . Chronic back pain   . ED (erectile dysfunction)   . Hematuria   . Hypothyroidism   . Nephrolithiasis   . Elevated serum creatinine   . Testicular/scrotal pain   . Malaise   . BPH with obstruction/lower urinary tract symptoms     Surgical History: Past Surgical History  Procedure Laterality Date  . Tonsillectomy    . Hernia repair Bilateral   . Nephrectomy Right     Home Medications:    Medication List       This list is accurate as of: 12/07/14  2:40 PM.  Always use your most recent med list.               ALPRAZolam 1 MG tablet  Commonly known as:  XANAX     fenofibrate 160 MG tablet     gabapentin 300 MG capsule  Commonly known as:  NEURONTIN     levothyroxine 88 MCG tablet  Commonly known as:  SYNTHROID, LEVOTHROID     metoprolol succinate 50 MG 24 hr tablet  Commonly known as:  TOPROL-XL     oxyCODONE 5 MG immediate release tablet  Commonly known as:  Oxy IR/ROXICODONE     QUEtiapine 200 MG tablet  Commonly known as:  SEROQUEL        Allergies:  Allergies  Allergen Reactions  . Amlodipine     Other reaction(s): Other (See Comments) Hard heart beat, insomnia  . Prednisone     Family History: Family  History  Problem Relation Age of Onset  . Prostate cancer Brother     Social History:  reports that he has been smoking.  He does not have any smokeless tobacco history on file. He reports that he drinks alcohol. He reports that he does not use illicit drugs.  ROS: UROLOGY Frequent Urination?: No Hard to postpone urination?: No Burning/pain with urination?: No Get up at night to urinate?: No Leakage of urine?: No Urine stream starts and stops?: No Trouble starting stream?: No Do you have to strain to urinate?: No Blood in urine?: No Urinary tract infection?: Yes Sexually transmitted disease?: No Injury to kidneys or bladder?: No Painful intercourse?: No Weak stream?: Yes Erection problems?: Yes Penile pain?: No  Gastrointestinal Nausea?: No Vomiting?: No Indigestion/heartburn?: No Diarrhea?: No Constipation?: Yes  Constitutional Fever: No Night sweats?: No Weight loss?: No Fatigue?: No  Skin Skin rash/lesions?: No Itching?: No  Eyes Blurred vision?: No Double vision?: No  Ears/Nose/Throat Sore throat?: No Sinus problems?: No  Hematologic/Lymphatic Swollen glands?: No Easy bruising?: No  Cardiovascular Leg swelling?: No Chest pain?: No  Respiratory Cough?: No Shortness of breath?: No  Endocrine Excessive thirst?: No  Musculoskeletal Back pain?: Yes Joint pain?: Yes  Neurological Headaches?: No Dizziness?: No  Psychologic Depression?: Yes Anxiety?: Yes  Physical Exam: BP 119/79 mmHg  Pulse 76  Ht 6\' 2"  (1.88 m)  Wt 214 lb 8 oz (97.297 kg)  BMI 27.53 kg/m2  Constitutional:  Alert and oriented, No acute distress. HEENT: [place] AT, moist mucus membranes.  Trachea midline, no masses. Cardiovascular: No clubbing, cyanosis, or edema. Respiratory: Normal respiratory effort, no increased work of breathing. GI: Abdomen is soft, nontender, nondistended, no abdominal masses GU: No CVA tenderness. Refused rectal exam in a patient with some  history of psychiatric problems and then taking multiple psychiatric drugs I did not to push the rectal exam. Testes and penis are normal and he he has a normal urethral meatus Skin: No rashes, bruises or suspicious lesions. Lymph: No cervical or inguinal adenopathy. Neurologic: Grossly intact, no focal deficits, moving all 4 extremities. Psychiatric: Normal mood and affect.  Laboratory Data: Lab Results  Component Value Date   WBC 10.1 12/08/2013   HGB 15.8 12/08/2013   HCT 47.5 12/08/2013   MCV 87 12/08/2013   PLT 244 12/08/2013    Lab Results  Component Value Date   CREATININE 1.80* 12/08/2013    No results found for: PSA  No results found for: TESTOSTERONE  No results found for: HGBA1C  Urinalysis    Component Value Date/Time   COLORURINE YELLOW* 11/24/2014 1342   COLORURINE Straw 09/18/2011 1827   APPEARANCEUR CLEAR* 11/24/2014 1342   APPEARANCEUR Clear 09/18/2011 1827   LABSPEC 1.021 11/24/2014 1342   LABSPEC 1.005 09/18/2011 1827   PHURINE 5.0 11/24/2014 1342   PHURINE 7.0 09/18/2011 1827   GLUCOSEU NEGATIVE 11/24/2014 1342   GLUCOSEU Negative 09/18/2011 1827   HGBUR NEGATIVE 11/24/2014 1342   HGBUR Negative 09/18/2011 1827   BILIRUBINUR NEGATIVE 11/24/2014 1342   BILIRUBINUR Negative 09/18/2011 1827   KETONESUR NEGATIVE 11/24/2014 1342   KETONESUR Negative 09/18/2011 1827   PROTEINUR NEGATIVE 11/24/2014 1342   PROTEINUR Negative 09/18/2011 1827   NITRITE NEGATIVE 11/24/2014 1342   NITRITE Negative 09/18/2011 1827   LEUKOCYTESUR 2+* 11/24/2014 1342   LEUKOCYTESUR Trace 09/18/2011 1827    Pertinent Imaging: Limited renal ultrasound ordered a recheck patient has a history of unilateral nephrectomy and probable calculi in the past  Assessment & Plan: BPH and history of calculi with unilateral kidney previous nephrectomy  1. Bad odor of urine Probably secondary to medications which she takes a multitude of meds including oxycodone Seroquel Xanax Neurontin  and Synthroid - Urinalysis, Complete  2. BPH (benign prostatic hyperplasia) History of benign prostatic hypertrophy with some hesitancy of urination. Whether this is due to his Seroquel and oxycodone is problematic this will place patient on tamsulosin 7 advisable but he wants no more medications as he has a hole shell full meds and he doesn't want take more medication for his difficulty voiding. We'll do a cystoscopy on the future. - PSA drawn today  3. H/O renal calculi Patient had day calculi last year and probably passed it. A retrograde in the stent and ureteroscopy for probable passed calculi with pain - US Renal; Future   No Follow-up on file.  Collier Flowers, [facility] [address], [place], [place] [address] [phone]

## 2014-12-08 LAB — PSA: Prostate Specific Ag, Serum: 0.7 ng/mL (ref 0.0–4.0)

## 2014-12-12 ENCOUNTER — Telehealth: Payer: Self-pay

## 2014-12-12 DIAGNOSIS — N200 Calculus of kidney: Secondary | ICD-10-CM

## 2014-12-12 NOTE — Telephone Encounter (Signed)
-----   Message from Nori Riis, PA-C sent at 12/12/2014  8:28 AM EDT ----- Patient's PSA is stable.  He needs a RUS in one month.

## 2014-12-12 NOTE — Telephone Encounter (Signed)
Spoke with pt in reference to lab results and rus. Pt voiced understanding.

## 2014-12-25 ENCOUNTER — Ambulatory Visit
Admission: RE | Admit: 2014-12-25 | Discharge: 2014-12-25 | Disposition: A | Discharge status: Home / Self Care | Payer: Medicare Other | Source: Ambulatory Visit | Attending: Urology | Admitting: Urology

## 2014-12-25 DIAGNOSIS — Z87442 Personal history of urinary calculi: Secondary | ICD-10-CM | POA: Insufficient documentation

## 2014-12-25 DIAGNOSIS — Z905 Acquired absence of kidney: Secondary | ICD-10-CM | POA: Insufficient documentation

## 2014-12-26 ENCOUNTER — Encounter: Payer: Self-pay | Admitting: Psychiatry

## 2014-12-26 ENCOUNTER — Ambulatory Visit (INDEPENDENT_AMBULATORY_CARE_PROVIDER_SITE_OTHER): Payer: Medicare Other | Admitting: Psychiatry

## 2014-12-26 VITALS — BP 122/88 | HR 86 | Temp 97.9°F | Ht 74.0 in | Wt 219.6 lb

## 2014-12-26 DIAGNOSIS — F339 Major depressive disorder, recurrent, unspecified: Secondary | ICD-10-CM

## 2014-12-26 MED ORDER — ALPRAZOLAM 1 MG PO TABS: 1.0000 mg | ORAL_TABLET | Freq: Four times a day (QID) | ORAL | Status: DC | PRN

## 2014-12-26 MED ORDER — QUETIAPINE FUMARATE 200 MG PO TABS: 200.0000 mg | ORAL_TABLET | Freq: Every day | ORAL | Status: DC

## 2014-12-26 NOTE — Progress Notes (Signed)
Psychiatric Initial Adult Assessment   Patient Identification: Zachary Reynolds MRN:  [id number] Date of Evaluation:  12/26/2014 Referral Source: LCSW Chief Complaint:  "Was seeing [name]." [place]; Anxiety; Depression; Stress; Fatigue     Visit Diagnosis:    ICD-9-CM ICD-10-CM   1. Major depression, recurrent, chronic (HCC) 296.30 F33.9    Diagnosis:   Patient Active Problem List   Diagnosis Date Noted  . Difficulty sleeping [G47.9] 05/01/2014  . Drug-induced tremor [G25.1] 02/22/2014  . Has a tremor [R25.1] 02/22/2014   History of Present Illness:  Patient indicates that he initially started getting some treatment by his primary care with Ativan in 2010. He states that he then ended up with an admission to behavioral medicine in April 2010 secondary to depression and anxiety. 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  Dates in the past she's experienced significant depression where he has had low energy, poor concentration, depressed mood, irritability and at times suicidal ideation. He denies any suicide attempts. He states that he had a major depressive episode in 2012 and that resulted in admission. He states that at that time he accidentally overdosed on some benzodiazepines. He states he has third admission that was sometime before 2013. He states now he feels fairly even keel on his current medications which consist of Seroquel 200 mg at bedtime and Ativan 1 mg 4 times a day as needed.  Cuss the issue of any manic episodes where he would be hyper or reckless. He states that he has had issues with racing thoughts and not sleeping but denies that it ever caused him trouble or dysfunction. He indicates he does not feel he is behaviors have ever been out of control or reckless secondary to his mood.  He states that he then saw Dr. head in for 3 years  until 2010 and then he began seeing [name]. Elements:  Duration:  as noted above. Associated Signs/Symptoms: Depression Symptoms:  depressed mood, anhedonia, difficulty concentrating, suicidal thoughts without plan, anxiety, loss of energy/fatigue, (Hypo) Manic Symptoms:  Flight of Ideas, Anxiety Symptoms:  Excessive Worry, Panic Symptoms, Psychotic Symptoms:  denies PTSD Symptoms: NA  Past Medical History:  Past Medical History  Diagnosis Date  . Renal disorder   . Slow urinary stream   . Hepatitis   . Constipation   . Cancer of right kidney ([place])   . Bipolar depression ([place])   . Chronic back pain   . ED (erectile dysfunction)   . Hematuria   . Hypothyroidism   . Nephrolithiasis   . Elevated serum creatinine   . Testicular/scrotal pain   . Malaise   . BPH with obstruction/lower urinary tract symptoms   . Anxiety   . Arrhythmia     Past Surgical History  Procedure Laterality Date  . Tonsillectomy    . Hernia repair Bilateral   . Nephrectomy Right    Family History:  Family History  Problem Relation Age of Onset  . Prostate cancer Brother   . Thyroid disease Brother   . Stroke Mother   . Hypertension Mother   . Thyroid disease Mother   . Macular degeneration Father   . Thyroid disease Sister   . Thyroid disease Sister    Social History:   Social History   Social History  . Marital Status: Single    Spouse  Name: N/A  . Number of Children: N/A  . Years of Education: N/A   Social History Main Topics  . Smoking status: Current Every Day Smoker    Types: Cigarettes    Start date: 12/26/1974  . Smokeless tobacco: Never Used  . Alcohol Use: No  . Drug Use: No  . Sexual Activity: Not Currently   Other Topics Concern  . None   Social History Narrative   Additional Social History: Patient indicates he had a good childhood. He has 2 sisters and one brother. He denies any forms of abuse. He does discuss some issues and inner conflict regarding  sexual orientation. He states that this started when he was a child in 14 you while he was in the [employer].  He graduated from high school. He did 8 years in the [employer]. 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  Patient lives across the street from his elderly parents and states he is very involved and carrying for them and assisting them with their needs.  He is not aware of any family history of mental illness.  Musculoskeletal: Strength & Muscle Tone: within normal limits Gait & Station: normal Patient leans: N/A  Psychiatric Specialty Exam: HPI  Review of Systems  Neurological: Positive for tremors.  Psychiatric/Behavioral: Negative for depression, suicidal ideas, hallucinations, memory loss and substance abuse. The patient is nervous/anxious and has insomnia.   All other systems reviewed and are negative.   Blood pressure 122/88, pulse 86, temperature 97.9 F (36.6 C), temperature source Tympanic, height 6\' 2"  (1.88 m), weight 219 lb 9.6 oz (99.61 kg), SpO2 96 %.Body mass index is 28.18 kg/(m^2).  General Appearance: Well Groomed  Eye Contact:  Good  Speech:  Normal Rate  Volume:  Normal  Mood:  Even keel  Affect:  Constricted  Thought Process:  Linear and Logical  Orientation:  Full (Time, Place, and Person)  Thought Content:  Negative  Suicidal Thoughts:  No  Homicidal Thoughts:  No  Memory:  Immediate;   Good Recent;   Good Remote;   Good  Judgement:  Good  Insight:  Good  Psychomotor Activity:  Negative  Concentration:  Good  Recall:  Good  Fund of Knowledge:Good  Language: Good  Akathisia:  Negative  Handed:  Right unknown   AIMS (if indicated):  Done 12/26/14 normal  Assets:  Communication Skills Desire for Improvement Others:  Cares for  parents  ADL's:  Intact  Cognition: WNL  Sleep:  Good with seroquel   Is the patient at risk to self?  No. Has the patient been a risk to self in the past 6 months?  No. Has the patient been a risk to self within the distant past?  No. reports accidental overdose on benzodiazepines in eyes any intentional suicide attempts Is the patient a risk to others?  No. Has the patient been a risk to others in the past 6 months?  No. Has the patient been a risk to others within the distant past?  No.  Allergies:   Allergies  Allergen Reactions  . Amlodipine     Other reaction(s): Other (See Comments) Hard heart beat, insomnia  . Nucynta [Tapentadol]   . Prednisone    Current Medications: Current Outpatient  Prescriptions  Medication Sig Dispense Refill  . ALPRAZolam (XANAX) 1 MG tablet Take 1 tablet (1 mg total) by mouth 4 (four) times daily as needed for anxiety. 120 tablet 1  . fenofibrate 160 MG tablet     . gabapentin (NEURONTIN) 300 MG capsule     . levothyroxine (SYNTHROID, LEVOTHROID) 88 MCG tablet     . metoprolol succinate (TOPROL-XL) 50 MG 24 hr tablet     . oxyCODONE (OXY IR/ROXICODONE) 5 MG immediate release tablet     . QUEtiapine (SEROQUEL) 200 MG tablet Take 1 tablet (200 mg total) by mouth at bedtime. 30 tablet 1   No current facility-administered medications for this visit.    Previous Psychotropic Medications: Yes  He indicates past trials of Latuda, Geodon, risperidone, Lunesta and Ambien. He states that most of the medications he tried in the past do not help him. He also reports prior trial of lorazepam and flurazepam. Medication list does indicate a trial of [place] S[place]ra. He is receiving gabapentin from his neurologist. He's had a past trial of trazodone. Substance Abuse History in the last 12 months:  No. He indicates that he did drink heavily while he was in the [employer] for 8 years but now does not abuse any alcohol or illicit drugs. He states there may have been some  marijuana use prior to 1988 but none since then. Consequences of Substance Abuse: NA  Medical Decision Making:  Established Problem, Stable/Improving (1) and Review of Medication Regimen & Side Effects (2)  Treatment Plan Summary: Medication management and Plan   Major depressive disorder, recurrent, severe without psychotic features-patient has been stable on Seroquel. His past provider list of the diagnosis of bipolar disorder, however patient denies any symptoms of mania hypomania. It also appears from his medication record from his previous psychiatrist that largely his medications have been used to target anxiety and sleep. Patient states that he is benefiting in regards to insomnia from Seroquel but he is concerned about side effects of affecting his glucose and his cholesterol levels. We discussed at his next visit perhaps changing his medication regimen or perhaps trying an SSRI as we gradually decrease his Seroquel.  Generalized anxiety disorder-continue alprazolam 1 mg 4 times a day as needed.  In regards to risk assessment the patient has risk factors of race, gender and an accidental overdose on benzodiazepines in 2012. He has protective factors of social supports and caring for his parents that live nearby, forward thinking, engage in treatment and response to medications. At this time low risk of imminent harm to himself or others.    Faith Rogue 10/4/20162:05 PM

## 2014-12-28 ENCOUNTER — Ambulatory Visit: Payer: Medicare Other | Admitting: Licensed Clinical Social Worker

## 2015-01-03 ENCOUNTER — Ambulatory Visit (INDEPENDENT_AMBULATORY_CARE_PROVIDER_SITE_OTHER): Payer: 59 | Admitting: Licensed Clinical Social Worker

## 2015-01-03 DIAGNOSIS — F339 Major depressive disorder, recurrent, unspecified: Secondary | ICD-10-CM | POA: Diagnosis not present

## 2015-01-05 ENCOUNTER — Telehealth: Payer: Self-pay | Admitting: Radiology

## 2015-01-05 NOTE — Telephone Encounter (Signed)
Pt called to cancel cysto scheduled for 01/09/15. [place] results were to be discussed at this appt.  I advised pt that I would have a nurse call him back regarding the [place] before we cancel the appt. Pt voices agreement. Pt last saw Dr Elnoria Howard on 12/07/14. Please advise.

## 2015-01-05 NOTE — Progress Notes (Signed)
   THERAPIST PROGRESS NOTE  Session Time: 16min  Participation Level: Active  Behavioral Response: CasualAlertDepressed  Type of Therapy: Individual Therapy  Treatment Goals addressed: Coping  Interventions: CBT, Motivational Interviewing, Supportive and Reframing  Summary: Zachary Reynolds is a 59 y.o. male who presents with continued symptoms of diagnosis.  Pt reports that he believes that therapy is not helpful but agreed to continue sessions for about 3 months.  Pt learned coping skills to alleviate symptoms.    Suicidal/Homicidal: Nowithout intent/plan  Therapist Response: Writer assessed mood and assisted with listing triggers.  Writer assisted with exploration of triggers and provided coping skills. Gave homework to journal and list coping skills used daily.  Plan: Return again in 4 weeks.  Diagnosis: Axis I: Major Depression    Axis II: No diagnosis    Lubertha South 01/05/2015

## 2015-01-05 NOTE — Telephone Encounter (Signed)
Spoke with pt in reference cysto appt. Pt was transferred to the front to cancel cysto appt but make a f/u with an [facility] provider.

## 2015-01-08 ENCOUNTER — Encounter: Payer: Self-pay | Admitting: *Deleted

## 2015-01-08 ENCOUNTER — Ambulatory Visit (INDEPENDENT_AMBULATORY_CARE_PROVIDER_SITE_OTHER): Payer: Medicare Other | Admitting: Urology

## 2015-01-08 VITALS — BP 128/84 | HR 75 | Ht 74.0 in | Wt 214.7 lb

## 2015-01-08 DIAGNOSIS — N400 Enlarged prostate without lower urinary tract symptoms: Secondary | ICD-10-CM | POA: Diagnosis not present

## 2015-01-08 DIAGNOSIS — Q600 Renal agenesis, unilateral: Secondary | ICD-10-CM | POA: Diagnosis not present

## 2015-01-08 DIAGNOSIS — IMO0002 Reserved for concepts with insufficient information to code with codable children: Secondary | ICD-10-CM

## 2015-01-08 DIAGNOSIS — Z87442 Personal history of urinary calculi: Secondary | ICD-10-CM | POA: Diagnosis not present

## 2015-01-08 NOTE — Progress Notes (Signed)
01/08/2015 2:31 PM   Lynnae January Kappes 1955/07/10 [id number]  Referring provider: Maryland Pink, MD [address] [facility] [place], [place] [address]  No chief complaint on file.   HPI: The patient is a 59 year old gentleman with a history of right nephrectomy, BPH, and nephrolithiasis presents for follow-up. He has a recentt renal ultrasound showing a normal left kidney without hydronephrosis or stones. He was initially seen with a complaint of foul spelling urine which is thought to be secondary to his psychiatric medications. He also had some urinary hesitancy secondary to BPH. He was not interested in starting Flomax at that time because he thought he was already on too many medications. He is a recent PSA which is 0.7. His I PSS was 10/3.   PMH: Past Medical History  Diagnosis Date  . Renal disorder   . Slow urinary stream   . Hepatitis   . Constipation   . Cancer of right kidney ([place])   . Bipolar depression ([place])   . Chronic back pain   . ED (erectile dysfunction)   . Hematuria     gross  . Hypothyroidism   . Nephrolithiasis   . Elevated serum creatinine   . Testicular/scrotal pain   . Malaise   . BPH with obstruction/lower urinary tract symptoms   . Anxiety   . Arrhythmia   . Weak urinary stream     Surgical History: Past Surgical History  Procedure Laterality Date  . Tonsillectomy    . Hernia repair Bilateral   . Nephrectomy Right     Home Medications:    Medication List       This list is accurate as of: 01/08/15  2:31 PM.  Always use your most recent med list.               ALPRAZolam 1 MG tablet  Commonly known as:  XANAX  Take 1 tablet (1 mg total) by mouth 4 (four) times daily as needed for anxiety.     fenofibrate 160 MG tablet     gabapentin 300 MG capsule  Commonly known as:  NEURONTIN     levothyroxine 88 MCG tablet  Commonly known as:  SYNTHROID, LEVOTHROID     metoprolol succinate 50 MG 24 hr tablet  Commonly known as:   TOPROL-XL     oxyCODONE 5 MG immediate release tablet  Commonly known as:  Oxy IR/ROXICODONE     QUEtiapine 200 MG tablet  Commonly known as:  SEROQUEL  Take 1 tablet (200 mg total) by mouth at bedtime.        Allergies:  Allergies  Allergen Reactions  . Amlodipine     Other reaction(s): Other (See Comments) Hard heart beat, insomnia  . Nucynta [Tapentadol]   . Prednisone     Family History: Family History  Problem Relation Age of Onset  . Prostate cancer Brother   . Thyroid disease Brother   . Stroke Mother   . Hypertension Mother   . Thyroid disease Mother   . Macular degeneration Father   . Thyroid disease Sister   . Thyroid disease Sister     Social History:  reports that he has been smoking Cigarettes.  He started smoking about 40 years ago. He has never used smokeless tobacco. He reports that he does not drink alcohol or use illicit drugs.  ROS:  Physical Exam: There were no vitals taken for this visit.  Constitutional:  Alert and oriented, No acute distress. HEENT: [place] AT, moist mucus membranes.  Trachea midline, no masses. Cardiovascular: No clubbing, cyanosis, or edema. Respiratory: Normal respiratory effort, no increased work of breathing. GI: Abdomen is soft, nontender, nondistended, no abdominal masses GU: No CVA tenderness.  Skin: No rashes, bruises or suspicious lesions. Lymph: No cervical or inguinal adenopathy. Neurologic: Grossly intact, no focal deficits, moving all 4 extremities. Psychiatric: Normal mood and affect.  Laboratory Data: Lab Results  Component Value Date   WBC 10.1 12/08/2013   HGB 15.8 12/08/2013   HCT 47.5 12/08/2013   MCV 87 12/08/2013   PLT 244 12/08/2013    Lab Results  Component Value Date   CREATININE 1.80* 12/08/2013    Lab Results  Component Value Date   PSA 0.7 12/07/2014    No results found for: TESTOSTERONE  No results found for:  HGBA1C  Urinalysis    Component Value Date/Time   COLORURINE YELLOW* 11/24/2014 1342   COLORURINE Straw 09/18/2011 1827   APPEARANCEUR CLEAR* 11/24/2014 1342   APPEARANCEUR Clear 09/18/2011 1827   LABSPEC 1.021 11/24/2014 1342   LABSPEC 1.005 09/18/2011 1827   PHURINE 5.0 11/24/2014 1342   PHURINE 7.0 09/18/2011 1827   GLUCOSEU Negative 12/07/2014 1410   GLUCOSEU Negative 09/18/2011 1827   HGBUR NEGATIVE 11/24/2014 1342   HGBUR Negative 09/18/2011 1827   BILIRUBINUR Negative 12/07/2014 1410   BILIRUBINUR NEGATIVE 11/24/2014 1342   BILIRUBINUR Negative 09/18/2011 1827   KETONESUR NEGATIVE 11/24/2014 1342   KETONESUR Negative 09/18/2011 1827   PROTEINUR NEGATIVE 11/24/2014 1342   PROTEINUR Negative 09/18/2011 1827   NITRITE Negative 12/07/2014 1410   NITRITE NEGATIVE 11/24/2014 1342   NITRITE Negative 09/18/2011 1827   LEUKOCYTESUR Trace* 12/07/2014 1410   LEUKOCYTESUR 2+* 11/24/2014 1342   LEUKOCYTESUR Trace 09/18/2011 1827    Pertinent Imaging:  CLINICAL DATA: History of right nephrectomy for malignancy, crystals detected in the urine several weeks ago, no pain or hematuria  EXAM: RENAL / URINARY TRACT ULTRASOUND COMPLETE  COMPARISON: Renal ultrasound of January 20, 2014  FINDINGS: Right Kidney:  The right kidney is surgically absent  Left Kidney:  Length: 13.2 cm. Echogenicity within normal limits. No hydronephrosis visualized. There is a lateral mid to lower pole cortical cyst measuring 1.8 x 1.3 x 1.6 cm. This has been previously demonstrated. No calcified shadowing stones are observed.  Bladder:  Appears normal for degree of bladder distention. The left ureteral jet was demonstrated.  IMPRESSION: Stable appearance of the left kidney without evidence of calcified stones nor hydronephrosis. The right kidney is surgically absent.     Assessment & Plan:    1. BPH The patient is not extremely any medications this time. He will follow-up  in one year with PVR, uroflow, and I PSS.  2. Solitary left kidney s/p nephrectomy -Stable  3. History of renal calculi -No evidence of disease  There are no diagnoses linked to this encounter.  No Follow-up on file.  Nickie Retort, MD  [facility] [address] [place],  [address] [phone]

## 2015-01-09 ENCOUNTER — Other Ambulatory Visit: Payer: Self-pay | Admitting: Urology

## 2015-01-15 ENCOUNTER — Other Ambulatory Visit: Payer: Medicare Other

## 2015-01-15 ENCOUNTER — Telehealth: Payer: Self-pay | Admitting: Urology

## 2015-01-15 DIAGNOSIS — N390 Urinary tract infection, site not specified: Secondary | ICD-10-CM

## 2015-01-15 LAB — URINALYSIS, COMPLETE
BILIRUBIN UA: NEGATIVE
Glucose, UA: NEGATIVE
Ketones, UA: NEGATIVE
Nitrite, UA: NEGATIVE
PH UA: 8 — AB (ref 5.0–7.5)
Protein, UA: NEGATIVE
RBC UA: NEGATIVE
Specific Gravity, UA: 1.015 (ref 1.005–1.030)
UUROB: 0.2 mg/dL (ref 0.2–1.0)

## 2015-01-15 LAB — MICROSCOPIC EXAMINATION
Bacteria, UA: NONE SEEN
EPITHELIAL CELLS (NON RENAL): NONE SEEN /HPF (ref 0–10)
RBC MICROSCOPIC, UA: NONE SEEN /HPF (ref 0–?)
RENAL EPITHEL UA: NONE SEEN /HPF
WBC UA: NONE SEEN /HPF (ref 0–?)

## 2015-01-15 NOTE — Telephone Encounter (Signed)
Pt called stating he thinks he has a UTI, symptoms include pain around waist, discoloration. He wants to drop off urine to see if he has an infection.

## 2015-01-22 ENCOUNTER — Telehealth: Payer: Self-pay | Admitting: Urology

## 2015-01-22 NOTE — Telephone Encounter (Signed)
Spoke with pt in reference to u/a. Pt stated at this time he is not having any problems. Nurse reinforced if he develops s/s again to give [place] a call. Pt voiced understanding.

## 2015-01-22 NOTE — Telephone Encounter (Signed)
Patient's UA was negative for infection.  He does not have a follow up appointment.  Is he still having his symptoms?

## 2015-01-24 ENCOUNTER — Encounter: Payer: Self-pay | Admitting: Psychiatry

## 2015-01-24 ENCOUNTER — Ambulatory Visit (INDEPENDENT_AMBULATORY_CARE_PROVIDER_SITE_OTHER): Payer: Medicare Other | Admitting: Psychiatry

## 2015-01-24 ENCOUNTER — Ambulatory Visit (INDEPENDENT_AMBULATORY_CARE_PROVIDER_SITE_OTHER): Payer: 59 | Admitting: Licensed Clinical Social Worker

## 2015-01-24 VITALS — BP 132/84 | HR 84 | Temp 97.3°F | Ht 74.0 in | Wt 218.0 lb

## 2015-01-24 DIAGNOSIS — F332 Major depressive disorder, recurrent severe without psychotic features: Secondary | ICD-10-CM

## 2015-01-24 MED ORDER — MIRTAZAPINE 15 MG PO TABS: 15.0000 mg | ORAL_TABLET | Freq: Every day | ORAL | Status: DC

## 2015-01-24 NOTE — Progress Notes (Signed)
[place] MD/PA/NP OP Progress Note  01/24/2015 4:03 PM Zachary Reynolds  MRN:  [id number]  Subjective:  Patient returns for follow-up of his major depressive disorder, recurrent severe without psychotic features. He said things have pretty much been the "same." Since his last visit. He states his main issue right now is that he perhaps is feeling somewhat depressed and also he's not sleeping well. He relates that he has not been motivated to do things and stated that he went several days without grooming himself. He states he did do it today however. He denies any kind of suicidal thoughts. He indicated he is been on multiple antidepressants in the past and indicated the only one he had not been on that he could recall was Paxil. However I did discuss Remeron because of his complaints of poor sleep and he indicated he had probably not been on that medication. Chief Complaint: Depressed Chief Complaint    Follow-up; Medication Refill; Anxiety; Depression; Panic Attack     Visit Diagnosis:     ICD-9-CM ICD-10-CM   1. Major depressive disorder, recurrent, severe without psychotic features ([place]) 296.33 F33.2     Past Medical History:  Past Medical History  Diagnosis Date  . Renal disorder   . Slow urinary stream   . Hepatitis   . Constipation   . Cancer of right kidney ([place])   . Bipolar depression ([place])   . Chronic back pain   . ED (erectile dysfunction)   . Hematuria     gross  . Hypothyroidism   . Nephrolithiasis   . Elevated serum creatinine   . Testicular/scrotal pain   . Malaise   . BPH with obstruction/lower urinary tract symptoms   . Anxiety   . Arrhythmia   . Weak urinary stream     Past Surgical History  Procedure Laterality Date  . Tonsillectomy    . Hernia repair Bilateral   . Nephrectomy Right    Family History:  Family History  Problem Relation Age of Onset  . Prostate cancer Brother   . Thyroid disease Brother   . Stroke Mother   . Hypertension Mother   . Thyroid  disease Mother   . Macular degeneration Father   . Thyroid disease Sister   . Thyroid disease Sister    Social History:  Social History   Social History  . Marital Status: Single    Spouse Name: N/A  . Number of Children: N/A  . Years of Education: N/A   Social History Main Topics  . Smoking status: Current Every Day Smoker    Types: Cigarettes    Start date: 12/26/1974  . Smokeless tobacco: Never Used  . Alcohol Use: No  . Drug Use: No  . Sexual Activity: Not Currently   Other Topics Concern  . None   Social History Narrative   Additional History:   Assessment:   Musculoskeletal: Strength & Muscle Tone: within normal limits Gait & Station: normal Patient leans: N/A  Psychiatric Specialty Exam: HPI  Review of Systems  Psychiatric/Behavioral: Positive for depression. Negative for suicidal ideas, hallucinations, memory loss and substance abuse. The patient is nervous/anxious and has insomnia.   All other systems reviewed and are negative.   Blood pressure 132/84, pulse 84, temperature 97.3 F (36.3 C), temperature source Tympanic, height 6\' 2"  (1.88 m), weight 218 lb (98.884 kg), SpO2 97 %.Body mass index is 27.98 kg/(m^2).  General Appearance: Well Groomed  Eye Contact:  Good  Speech:  Slightly slow rate  Volume:  Normal  Mood:  Depressed  Affect:  Constricted  Thought Process:  Logical  Orientation:  Full (Time, Place, and Person)  Thought Content:  Negative  Suicidal Thoughts:  No  Homicidal Thoughts:  No  Memory:  Immediate;   Good Recent;   Good Remote;   Good  Judgement:  Good  Insight:  Good  Psychomotor Activity:  Negative  Concentration:  Good  Recall:  Good  Fund of Knowledge: Good  Language: Good  Akathisia:  Negative  Handed:    AIMS (if indicated):    Assets:  Communication Skills Desire for Improvement  ADL's:  Intact  Cognition: WNL  Sleep:  poor   Is the patient at risk to self?  No. Has the patient been a risk to self in the  past 6 months?  No. Has the patient been a risk to self within the distant past?  No. Is the patient a risk to others?  No. Has the patient been a risk to others in the past 6 months?  No. Has the patient been a risk to others within the distant past?  No.  Current Medications: Current Outpatient Prescriptions  Medication Sig Dispense Refill  . ALPRAZolam (XANAX) 1 MG tablet Take 1 tablet (1 mg total) by mouth 4 (four) times daily as needed for anxiety. 120 tablet 1  . fenofibrate 160 MG tablet     . gabapentin (NEURONTIN) 300 MG capsule     . levothyroxine (SYNTHROID, LEVOTHROID) 88 MCG tablet     . metoprolol succinate (TOPROL-XL) 50 MG 24 hr tablet     . oxyCODONE (ROXICODONE) 15 MG immediate release tablet     . QUEtiapine (SEROQUEL) 200 MG tablet Take 1 tablet (200 mg total) by mouth at bedtime. 30 tablet 1  . mirtazapine (REMERON) 15 MG tablet Take 1 tablet (15 mg total) by mouth at bedtime. 30 tablet 1   No current facility-administered medications for this visit.    Medical Decision Making:  Established Problem, Stable/Improving (1), New Problem, with no additional work-up planned (3), Review of Medication Regimen & Side Effects (2) and Review of New Medication or Change in Dosage (2)  Treatment Plan Summary:Medication management and Plan Major depressive disorder, recurrent severe without psychotic features-patient's largely been maintained on his Seroquel will and alprazolam. However given his report now of depressed mood, low motivation, anhedonia and poor sleep regarding initiate treatment with mirtazapine. Risk and benefits of been discussed patient's able consent. We'll start mirtazapine 15 mg at bedtime. He'll continue on his Seroquel will 200 mg at bedtime.   Anxiety-he will continue his alprazolam 1 mg 4 times a day.   Insomnia-hopefully of a response from the mirtazapine as discussed above.  Patient will follow up in 1 month. He's been encouraged colony questions  concerns prior to his next appointment.   Faith Rogue 01/24/2015, 4:03 PM

## 2015-01-29 NOTE — Progress Notes (Signed)
   THERAPIST PROGRESS NOTE  Session Time: 49min  Participation Level: Active  Behavioral Response: CasualAlertDepressed  Type of Therapy: Individual Therapy  Treatment Goals addressed: Coping  Interventions: CBT, Motivational Interviewing, Solution Focused, Strength-based, Supportive and Reframing  Summary: Zachary Reynolds is a 59 y.o. male who presents with symptoms of his diagnosis.  Zachary Reynolds is making progress but not on a consistent basis. Staff reviewed clinical documentation regarding progression of goals with Zachary Reynolds. Zachary Reynolds encouraged to be consistent with enforcing consequences for negative behavior. Zachary Reynolds encouraged to create age appropriate limits and be firm when enforcing those limits. Zachary Reynolds encouraged to engage in open dialogue on a regular basis to keep the lines of communication open. Zachary Reynolds taught positive coping skills to assist in managing anger in times of stress. Zachary Reynolds encouraged to recognize "trigger points" for anger and use deep breathing exercises or "thought stopping" method to alleviate anger in times of stress. Zachary Reynolds encouraged to utilize positive reinforcement when Zachary Reynolds is meeting goals on a regular basis. Zachary Reynolds praised for active participation in therapy session.  At the end of the session, Therapist concluded that Zachary Reynolds is making slight progress inconsistently.   Suicidal/Homicidal: Nowithout intent/plan  Therapist Response: Therapist reviewed clinical documentation regarding progression of goals with Zachary Reynolds. Therapist asked for update of Zachary Reynolds's current behavior. Therapist learned that Zachary Reynolds continues to display lack of communication and cycling mood. Therapist reminded Zachary Reynolds of natural consequences. Therapist encouraged Zachary Reynolds to set daily goals and limits. Therapist encouraged Zachary Reynolds to engage in open dialogue on a regular basis to keep the lines of communication open with authority figures and friends. Therapist taught Zachary Reynolds positive coping skills to  assist in managing anger in times of stress. Therapist encouraged Zachary Reynolds to recognize "trigger points" for anger and use deep breathing exercises or "thought stopping" method to alleviate anger in times of stress. Therapist encouraged Zachary Reynolds to utilize positive reinforcement when he is meeting goals on a regular basis. Therapist praised Zachary Reynolds for active participation in therapy session.  Plan: Return again in 2 weeks.  Diagnosis: Axis I: Major Depression    Axis II: No diagnosis    Zachary Reynolds 01/29/2015

## 2015-02-23 ENCOUNTER — Ambulatory Visit (INDEPENDENT_AMBULATORY_CARE_PROVIDER_SITE_OTHER): Payer: 59 | Admitting: Psychiatry

## 2015-02-23 ENCOUNTER — Encounter: Payer: Self-pay | Admitting: Psychiatry

## 2015-02-23 VITALS — BP 140/98 | HR 67 | Temp 97.3°F | Ht 74.0 in | Wt 223.6 lb

## 2015-02-23 DIAGNOSIS — F339 Major depressive disorder, recurrent, unspecified: Secondary | ICD-10-CM

## 2015-02-23 MED ORDER — ZOLPIDEM TARTRATE ER 6.25 MG PO TBCR: 6.2500 mg | EXTENDED_RELEASE_TABLET | Freq: Every evening | ORAL | Status: DC | PRN

## 2015-02-23 MED ORDER — ALPRAZOLAM 1 MG PO TABS: 1.0000 mg | ORAL_TABLET | Freq: Four times a day (QID) | ORAL | Status: DC | PRN

## 2015-02-23 MED ORDER — QUETIAPINE FUMARATE 200 MG PO TABS: 200.0000 mg | ORAL_TABLET | Freq: Every day | ORAL | Status: DC

## 2015-02-23 NOTE — Progress Notes (Signed)
BH MD/PA/NP OP Progress Note  02/23/2015 2:22 PM Zachary Reynolds  MRN:  [id number]  Subjective:  Patient returns for follow-up of his major depressive disorder, recurrent, chronic. Patient indicates he still is busy taking care of his parents. However he states outside of that he does not engage in any activities other than perhaps passing time watching TV. He states he has no interest in anything and continues to be depressed. He indicated he is been on numerous antidepressants and none of them have worked. He is aware of ECT as an option. However he did not want to do something that and evasive. I did mention to him transmit magnetic stimulation and gave him some literature to review.  He states that sleep continues to be a problem for him. He states that the only reason he is continued on Seroquel. He states he sleeps maybe 5 hours a night but would like to sleep more. He states he has been on triazolam in the past but he was aware that I will was not planning prescribing to benzodiazepines. He indicated he had been on several sleep medications Ambien, and Lunesta. He states that he got some hours of sleep at these medications but they did not last very long. He indicated he has not been on the extended release form of the Ambien.  At the last visit we tried to start some Remeron to address both his depression and complaints of insomnia. He states he was unable to take this medication because it gave him palpitations. Chief Complaint: Depressed Chief Complaint    Follow-up; Medication Refill; Medication Problem     Visit Diagnosis:     ICD-9-CM ICD-10-CM   1. Major depression, recurrent, chronic (HCC) 296.30 F33.9     Past Medical History:  Past Medical History  Diagnosis Date  . Renal disorder   . Slow urinary stream   . Hepatitis   . Constipation   . Cancer of right kidney ([place])   . Bipolar depression ([place])   . Chronic back pain   . ED (erectile dysfunction)   . Hematuria     gross   . Hypothyroidism   . Nephrolithiasis   . Elevated serum creatinine   . Testicular/scrotal pain   . Malaise   . BPH with obstruction/lower urinary tract symptoms   . Anxiety   . Arrhythmia   . Weak urinary stream     Past Surgical History  Procedure Laterality Date  . Tonsillectomy    . Hernia repair Bilateral   . Nephrectomy Right    Family History:  Family History  Problem Relation Age of Onset  . Prostate cancer Brother   . Thyroid disease Brother   . Stroke Mother   . Hypertension Mother   . Thyroid disease Mother   . Macular degeneration Father   . Thyroid disease Sister   . Thyroid disease Sister    Social History:  Social History   Social History  . Marital Status: Single    Spouse Name: N/A  . Number of Children: N/A  . Years of Education: N/A   Social History Main Topics  . Smoking status: Current Every Day Smoker    Types: Cigarettes    Start date: 12/26/1974  . Smokeless tobacco: Never Used  . Alcohol Use: No  . Drug Use: No  . Sexual Activity: Not Currently   Other Topics Concern  . None   Social History Narrative   Additional History:   Assessment:   Musculoskeletal:  Strength & Muscle Tone: within normal limits Gait & Station: normal Patient leans: N/A  Psychiatric Specialty Exam: HPI  Review of Systems  Psychiatric/Behavioral: Positive for depression. Negative for suicidal ideas, hallucinations, memory loss and substance abuse. The patient has insomnia. The patient is not nervous/anxious.   All other systems reviewed and are negative.   Blood pressure 140/98, pulse 67, temperature 97.3 F (36.3 C), temperature source Tympanic, height 6\' 2"  (1.88 m), weight 223 lb 9.6 oz (101.424 kg), SpO2 96 %.Body mass index is 28.7 kg/(m^2).  General Appearance: Well Groomed  Eye Contact:  Good  Speech:  Slightly slow rate  Volume:  Normal  Mood:  Depressed  Affect:  Constricted  Thought Process:  Logical  Orientation:  Full (Time, Place, and  Person)  Thought Content:  Negative  Suicidal Thoughts:  No  Homicidal Thoughts:  No  Memory:  Immediate;   Good Recent;   Good Remote;   Good  Judgement:  Good  Insight:  Good  Psychomotor Activity:  Negative  Concentration:  Good  Recall:  Good  Fund of Knowledge: Good  Language: Good  Akathisia:  Negative  Handed:    AIMS (if indicated):    Assets:  Communication Skills Desire for Improvement  ADL's:  Intact  Cognition: WNL  Sleep:  poor   Is the patient at risk to self?  No. Has the patient been a risk to self in the past 6 months?  No. Has the patient been a risk to self within the distant past?  No. Is the patient a risk to others?  No. Has the patient been a risk to others in the past 6 months?  No. Has the patient been a risk to others within the distant past?  No.  Current Medications: Current Outpatient Prescriptions  Medication Sig Dispense Refill  . ALPRAZolam (XANAX) 1 MG tablet Take 1 tablet (1 mg total) by mouth 4 (four) times daily as needed for anxiety. 120 tablet 2  . fenofibrate 160 MG tablet     . gabapentin (NEURONTIN) 300 MG capsule     . levothyroxine (SYNTHROID, LEVOTHROID) 88 MCG tablet     . metoprolol succinate (TOPROL-XL) 50 MG 24 hr tablet     . oxyCODONE (ROXICODONE) 15 MG immediate release tablet     . QUEtiapine (SEROQUEL) 200 MG tablet Take 1 tablet (200 mg total) by mouth at bedtime. 30 tablet 2  . zolpidem (AMBIEN CR) 6.25 MG CR tablet Take 1 tablet (6.25 mg total) by mouth at bedtime as needed for sleep. 30 tablet 1   No current facility-administered medications for this visit.    Medical Decision Making:  Established Problem, Stable/Improving (1), New Problem, with no additional work-up planned (3), Review of Medication Regimen & Side Effects (2) and Review of New Medication or Change in Dosage (2)  Treatment Plan Summary:Medication management and Plan Major depressive disorder, recurrent severe without psychotic features-patient's  largely been maintained on his Seroquel will and alprazolam. Given he reports numerous trials with lack of response does appear his depression may be more at a chronic and baseline.Marland Kitchen He'll continue on his Seroquel will 200 mg at bedtime. However as discussed above I have given him information on Rams magnetic stimulation.  Anxiety-he will continue his alprazolam 1 mg 4 times a day.  Insomnia-we will start some Ambien CR 6.25 mg at bedtime as needed. Risk and benefits of been discussing patient's able to consent.  Patient will follow up in 1 month. He's  been encouraged colony questions concerns prior to his next appointment.   Faith Rogue 02/23/2015, 2:22 PM

## 2015-02-26 ENCOUNTER — Telehealth: Payer: Self-pay

## 2015-02-26 ENCOUNTER — Telehealth: Payer: Self-pay | Admitting: Psychiatry

## 2015-02-26 MED ORDER — RAMELTEON 8 MG PO TABS: 8.0000 mg | ORAL_TABLET | Freq: Every day | ORAL | Status: DC

## 2015-02-26 NOTE — Telephone Encounter (Signed)
denied pt zolpidem tartrate er pt has to first try three (3) of the following covered medication belsomra, rozerem, trazodone

## 2015-02-26 NOTE — Telephone Encounter (Signed)
Pharmacy called rx discontinued.

## 2015-02-26 NOTE — Telephone Encounter (Signed)
Spoke with patient and informed him that his insurance company has required trials of certain medications prior to approving the Ambien extended release. They require trials of trazodone, Belsomra and Rozerem. Patient is artery had a trial of trazodone and Belsomra. I discussed with him the Rozerem. I discussed risk and benefits to consent. We'll start some Rozerem 8 mg at bedtime for insomnia. AW

## 2015-03-30 ENCOUNTER — Other Ambulatory Visit: Payer: Self-pay | Admitting: Psychiatry

## 2015-03-30 ENCOUNTER — Ambulatory Visit (INDEPENDENT_AMBULATORY_CARE_PROVIDER_SITE_OTHER): Payer: 59 | Admitting: Psychiatry

## 2015-03-30 ENCOUNTER — Encounter: Payer: Self-pay | Admitting: Psychiatry

## 2015-03-30 VITALS — BP 122/76 | HR 76 | Temp 97.6°F | Ht 74.0 in | Wt 228.4 lb

## 2015-03-30 DIAGNOSIS — F332 Major depressive disorder, recurrent severe without psychotic features: Secondary | ICD-10-CM | POA: Diagnosis not present

## 2015-03-30 MED ORDER — QUETIAPINE FUMARATE 200 MG PO TABS: 200.0000 mg | ORAL_TABLET | Freq: Every day | ORAL | Status: DC

## 2015-03-30 MED ORDER — ALPRAZOLAM 1 MG PO TABS: 1.0000 mg | ORAL_TABLET | Freq: Three times a day (TID) | ORAL | Status: DC | PRN

## 2015-03-30 MED ORDER — TRIAZOLAM 0.25 MG PO TABS: 0.2500 mg | ORAL_TABLET | Freq: Every evening | ORAL | Status: DC | PRN

## 2015-03-30 NOTE — Progress Notes (Signed)
BH MD/PA/NP OP Progress Note  03/30/2015 1:54 PM Zachary Reynolds  MRN:  [id number]  Subjective:  Patient returns for follow-up of his major depressive disorder, recurrent, chronic. Patient states he still feels depressed. He indicates he wonders if he has seasonal affective disorder. I did discuss with them that one option would be to get a light box however he indicated he prefers to just open his blinds completely during the day. He states he's busy caring for his parents are as turning 53. He states they benefit from his presence because he is able to help them and drive them places and he states he's benefits from their presence.  Tin use on his medications however he states the Remeron did not work for him. It appears we have tried numerous medications to try to help him sleep. He states he's had a past good response to triazolam. I indicated that we had to be careful as is 30 taking one benzodiazepine. We have decided that we going to decrease his alprazolam dose from 4 times a day to 3 times a day and then start some triazolam. I spent time discussing with him to make sure that he takes the medications spread out and does not take his alprazolam when he takes the triazolam at bedtime. He will take his last dose of alprazolam at 3 or 4 PM Chief Complaint: Depressed Chief Complaint    Follow-up; Medication Refill     Visit Diagnosis:     ICD-9-CM ICD-10-CM   1. Major depressive disorder, recurrent, severe without psychotic features ([place]) 296.33 F33.2     Past Medical History:  Past Medical History  Diagnosis Date  . Renal disorder   . Slow urinary stream   . Hepatitis   . Constipation   . Cancer of right kidney ([place])   . Bipolar depression ([place])   . Chronic back pain   . ED (erectile dysfunction)   . Hematuria     gross  . Hypothyroidism   . Nephrolithiasis   . Elevated serum creatinine   . Testicular/scrotal pain   . Malaise   . BPH with obstruction/lower urinary tract symptoms    . Anxiety   . Arrhythmia   . Weak urinary stream     Past Surgical History  Procedure Laterality Date  . Tonsillectomy    . Hernia repair Bilateral   . Nephrectomy Right    Family History:  Family History  Problem Relation Age of Onset  . Prostate cancer Brother   . Thyroid disease Brother   . Stroke Mother   . Hypertension Mother   . Thyroid disease Mother   . Macular degeneration Father   . Thyroid disease Sister   . Thyroid disease Sister    Social History:  Social History   Social History  . Marital Status: Single    Spouse Name: N/A  . Number of Children: N/A  . Years of Education: N/A   Social History Main Topics  . Smoking status: Current Every Day Smoker    Types: Cigarettes    Start date: 12/26/1974  . Smokeless tobacco: Never Used  . Alcohol Use: No  . Drug Use: No  . Sexual Activity: Not Currently   Other Topics Concern  . None   Social History Narrative   Additional History:   Assessment:   Musculoskeletal: Strength & Muscle Tone: within normal limits Gait & Station: normal Patient leans: N/A  Psychiatric Specialty Exam: HPI  Review of Systems  Psychiatric/Behavioral: Positive  for depression. Negative for suicidal ideas, hallucinations, memory loss and substance abuse. The patient has insomnia. The patient is not nervous/anxious.   All other systems reviewed and are negative.   Blood pressure 122/76, pulse 76, temperature 97.6 F (36.4 C), temperature source Tympanic, height 6\' 2"  (1.88 m), weight 228 lb 6.4 oz (103.602 kg), SpO2 95 %.Body mass index is 29.31 kg/(m^2).  General Appearance: Well Groomed  Eye Contact:  Good  Speech:  Slightly slow rate  Volume:  Normal  Mood:  Depressed  Affect:  Constricted  Thought Process:  Logical  Orientation:  Full (Time, Place, and Person)  Thought Content:  Negative  Suicidal Thoughts:  No  Homicidal Thoughts:  No  Memory:  Immediate;   Good Recent;   Good Remote;   Good  Judgement:  Good   Insight:  Good  Psychomotor Activity:  Negative  Concentration:  Good  Recall:  Good  Fund of Knowledge: Good  Language: Good  Akathisia:  Negative  Handed:    AIMS (if indicated):  Done 12/25/2013 and was normal  Assets:  Communication Skills Desire for Improvement  ADL's:  Intact  Cognition: WNL  Sleep:  poor   Is the patient at risk to self?  No. Has the patient been a risk to self in the past 6 months?  No. Has the patient been a risk to self within the distant past?  No. Is the patient a risk to others?  No. Has the patient been a risk to others in the past 6 months?  No. Has the patient been a risk to others within the distant past?  No.  Current Medications: Current Outpatient Prescriptions  Medication Sig Dispense Refill  . fenofibrate 160 MG tablet     . gabapentin (NEURONTIN) 300 MG capsule     . levothyroxine (SYNTHROID, LEVOTHROID) 88 MCG tablet     . metoprolol succinate (TOPROL-XL) 50 MG 24 hr tablet     . oxyCODONE (ROXICODONE) 15 MG immediate release tablet     . ALPRAZolam (XANAX) 1 MG tablet Take 1 tablet (1 mg total) by mouth 3 (three) times daily as needed for anxiety. 90 tablet 4  . QUEtiapine (SEROQUEL) 200 MG tablet Take 1 tablet (200 mg total) by mouth at bedtime. 30 tablet 4  . triazolam (HALCION) 0.25 MG tablet Take 1 tablet (0.25 mg total) by mouth at bedtime as needed for sleep. 60 tablet 1   No current facility-administered medications for this visit.    Medical Decision Making:  Established Problem, Stable/Improving (1), New Problem, with no additional work-up planned (3), Review of Medication Regimen & Side Effects (2) and Review of New Medication or Change in Dosage (2)  Treatment Plan Summary:Medication management and Plan Major depressive disorder, recurrent severe without psychotic features-patient's largely been maintained on his Seroquel will and alprazolam. Given he reports numerous trials with lack of response does appear his depression  may be more at a chronic and baseline.Marland Kitchen He'll continue on his Seroquel will 200 mg at bedtime. However as discussed above I have given him information on Rams magnetic stimulation.  Anxiety-Decrease his alprazolam 1 mg 4 times a day to 1 mg 3 times a day.  Insomnia-we will start triazolam 0.5 mg at bedtime as needed for insomnia. Risk and benefits of been discussing patient's able to consent.  Patient will follow up in 1 month. He is aware of my departure from this clinic and that he will be able to continue treatment with another  provider in this clinic. He's been encouraged call with questions concerns prior to his next appointment.   Faith Rogue 03/30/2015, 1:54 PM

## 2015-04-03 ENCOUNTER — Telehealth: Payer: Self-pay

## 2015-04-03 NOTE — Telephone Encounter (Signed)
in reqard to patient medication halcin.  .25mg  #60.  this medication is not ment for long term use and pt pcp only give patient a week supply  of medication because of pt history of over dose on medication.  They would like to speak with doctor about medication

## 2015-04-03 NOTE — Telephone Encounter (Signed)
Received a call from the patient's pharmacy in regards to the additional prescription for his triazolam. They indicated their concerns about him getting the triazolam, alprazolam and the opioid from his primary care. I discussed with patient that ideally we'll we needed to do was either taper him off his alprazolam and then he could get a benzodiazepine at bedtime such as temazepam. However patient indicated he would rather just continue with his current regimen of alprazolam 1 mg 4 times a day. I've spoken with the pharmacy and clarified this as the plan. We will go ahead and discontinue the triazolam and patient was agreeable with this plan. AW

## 2015-04-04 NOTE — Telephone Encounter (Signed)
received a denial for the triazolam, is denied because the use is not supported by the FDA or by one of the medicare approved references for treating medical conditions long term insomnia. high risk medications and not recommeded.

## 2015-04-04 NOTE — Telephone Encounter (Signed)
Triazolam has been discontinued my discussion with patient yesterday and with pharmacy yesterday. AW

## 2015-04-30 ENCOUNTER — Encounter: Payer: Self-pay | Admitting: Psychiatry

## 2015-04-30 ENCOUNTER — Ambulatory Visit (INDEPENDENT_AMBULATORY_CARE_PROVIDER_SITE_OTHER): Payer: 59 | Admitting: Psychiatry

## 2015-04-30 VITALS — BP 142/88 | HR 103 | Temp 98.8°F | Ht 74.0 in | Wt 232.0 lb

## 2015-04-30 DIAGNOSIS — F332 Major depressive disorder, recurrent severe without psychotic features: Secondary | ICD-10-CM | POA: Diagnosis not present

## 2015-04-30 MED ORDER — ALPRAZOLAM 1 MG PO TABS: 1.0000 mg | ORAL_TABLET | Freq: Four times a day (QID) | ORAL | Status: DC | PRN

## 2015-04-30 NOTE — Progress Notes (Signed)
BH MD/PA/NP OP Progress Note  04/30/2015 1:43 PM Zachary Reynolds  MRN:  [id number]  Subjective:  Patient returns for follow-up of his major depressive disorder, recurrent, chronic. He indicates that he is under a lot of stress. He states the stress comes from caring for his parents. He states his father is 71 and his mother is also elderly and has a stroke which impacts her mobility. He states he's driving them around to various appointments and errands. He states he does have some pride in fact that he is helping them because they helped him. However he states is a lot of stress on him.  He comes today with a rather long list of previous psychiatric medications that consist of mood stabilizers and antipsychotics and antidepressants. He has dated that time that he has been on these medications going back to 2005 when he was on some lorazepam from his family physician. The list is long and extensive. I am going to place it in the scanning been so it can be in the media file of his chart. However from looking at the listed does appear the only antidepressants that he has not been on have been trintellix and fetzima. He does not have tricyclics or MAO-i. Discussed with him given the number of medications he is stated he cannot tolerate due to side effects or that were ineffective that perhaps he might want to think about a consultation for transcranial magnetic stimulation or ECT. Patient indicates he has an appointment to see a back specialist and he feels like if his back pain can be improved his depression will likely improve. He prefers to wait until he sees the specialist until he makes any additional medication changes or pursues the above consultations. Chief Complaint: Depressed Chief Complaint    Depression; Follow-up; Medication Refill; Fatigue; Stress     Visit Diagnosis:     ICD-9-CM ICD-10-CM   1. Major depressive disorder, recurrent, severe without psychotic features ([place]) 296.33 F33.2      Past Medical History:  Past Medical History  Diagnosis Date  . Renal disorder   . Slow urinary stream   . Hepatitis   . Constipation   . Cancer of right kidney ([place])   . Bipolar depression ([place])   . Chronic back pain   . ED (erectile dysfunction)   . Hematuria     gross  . Hypothyroidism   . Nephrolithiasis   . Elevated serum creatinine   . Testicular/scrotal pain   . Malaise   . BPH with obstruction/lower urinary tract symptoms   . Anxiety   . Arrhythmia   . Weak urinary stream     Past Surgical History  Procedure Laterality Date  . Tonsillectomy    . Hernia repair Bilateral   . Nephrectomy Right    Family History:  Family History  Problem Relation Age of Onset  . Prostate cancer Brother   . Thyroid disease Brother   . Stroke Mother   . Hypertension Mother   . Thyroid disease Mother   . Macular degeneration Father   . Thyroid disease Sister   . Thyroid disease Sister    Social History:  Social History   Social History  . Marital Status: Single    Spouse Name: N/A  . Number of Children: N/A  . Years of Education: N/A   Social History Main Topics  . Smoking status: Current Every Day Smoker    Types: Cigarettes    Start date: 12/26/1974  . Smokeless  tobacco: Never Used  . Alcohol Use: No  . Drug Use: No  . Sexual Activity: Not Currently   Other Topics Concern  . None   Social History Narrative   Additional History:   Assessment:   Musculoskeletal: Strength & Muscle Tone: within normal limits Gait & Station: normal Patient leans: N/A  Psychiatric Specialty Exam: Depression        Associated symptoms include insomnia (responds to some extent to the Seroquel).  Associated symptoms include no suicidal ideas.   Review of Systems  Psychiatric/Behavioral: Positive for depression (as has largely remained at baseline and not worsened). Negative for suicidal ideas, hallucinations, memory loss and substance abuse. The patient has insomnia  (responds to some extent to the Seroquel). The patient is not nervous/anxious.   All other systems reviewed and are negative.   Blood pressure 142/88, pulse 103, temperature 98.8 F (37.1 C), temperature source Tympanic, height 6\' 2"  (1.88 m), weight 232 lb (105.235 kg), SpO2 95 %.Body mass index is 29.77 kg/(m^2).  General Appearance: Well Groomed  Eye Contact:  Good  Speech:  Slightly slow rate  Volume:  Normal  Mood:  Depressed  Affect:  Constricted  Thought Process:  Logical  Orientation:  Full (Time, Place, and Person)  Thought Content:  Negative  Suicidal Thoughts:  No  Homicidal Thoughts:  No  Memory:  Immediate;   Good Recent;   Good Remote;   Good  Judgement:  Good  Insight:  Good  Psychomotor Activity:  Negative  Concentration:  Good  Recall:  Good  Fund of Knowledge: Good  Language: Good  Akathisia:  Negative  Handed:    AIMS (if indicated):  Done 12/25/2013 and was normal  Assets:  Communication Skills Desire for Improvement  ADL's:  Intact  Cognition: WNL  Sleep:  poor   Is the patient at risk to self?  No. Has the patient been a risk to self in the past 6 months?  No. Has the patient been a risk to self within the distant past?  No. Is the patient a risk to others?  No. Has the patient been a risk to others in the past 6 months?  No. Has the patient been a risk to others within the distant past?  No.  Current Medications: Current Outpatient Prescriptions  Medication Sig Dispense Refill  . ALPRAZolam (XANAX) 1 MG tablet Take 1 tablet (1 mg total) by mouth 4 (four) times daily as needed for anxiety. 120 tablet 2  . fenofibrate 160 MG tablet     . gabapentin (NEURONTIN) 300 MG capsule     . levothyroxine (SYNTHROID, LEVOTHROID) 88 MCG tablet     . metoprolol succinate (TOPROL-XL) 50 MG 24 hr tablet     . oxyCODONE (ROXICODONE) 15 MG immediate release tablet     . QUEtiapine (SEROQUEL) 200 MG tablet Take 1 tablet (200 mg total) by mouth at bedtime. 30  tablet 4   No current facility-administered medications for this visit.    Medical Decision Making:  Established Problem, Stable/Improving (1), New Problem, with no additional work-up planned (3), Review of Medication Regimen & Side Effects (2) and Review of New Medication or Change in Dosage (2)  Treatment Plan Summary:Medication management and Plan Major depressive disorder, recurrent severe without psychotic features-patient's largely been maintained on his Seroquel will and alprazolam. Given he reports numerous trials with lack of response does appear his depression may be more at a chronic and baseline.Marland Kitchen He'll continue on his Seroquel will 200  mg at bedtime.   Anxiety-at the last visit we had decreased his alprazolam because we're going to try to give him some triazolam at bedtime. However his pharmacy strongly objected to this even though we were going to work towards decreasing his alprazolam. As such we will return his alprazolam to his previous dose of 1 mg 4 times a day as needed.  Insomnia-the triazolam has been discontinued. Patient will continue to try to use his Seroquel as he states it does help him sleep. He states that he might want to consider in the future going from 200 mg to 300 mg. However he prefers to continue the 200 mg until he sees the back specialist.  Patient will follow up in 2 month. He is aware of my departure from this clinic and that he will be able to continue treatment with another provider in this clinic. He's been encouraged call with questions concerns prior to his next appointment.   Faith Rogue 04/30/2015, 1:43 PM

## 2015-05-16 ENCOUNTER — Telehealth: Payer: Self-pay

## 2015-05-16 NOTE — Telephone Encounter (Signed)
pt called states that he needs a letter for him to get out of jury duty. pt needs it by march.

## 2015-05-25 ENCOUNTER — Telehealth: Payer: Self-pay | Admitting: Psychiatry

## 2015-05-28 NOTE — Telephone Encounter (Signed)
Patient will need to be seen by me before I can give a letter.

## 2015-07-03 ENCOUNTER — Encounter: Payer: Self-pay | Admitting: Psychiatry

## 2015-07-03 ENCOUNTER — Ambulatory Visit (INDEPENDENT_AMBULATORY_CARE_PROVIDER_SITE_OTHER): Payer: 59 | Admitting: Psychiatry

## 2015-07-03 VITALS — BP 120/82 | HR 85 | Temp 98.6°F | Ht 74.0 in | Wt 234.6 lb

## 2015-07-03 DIAGNOSIS — F339 Major depressive disorder, recurrent, unspecified: Secondary | ICD-10-CM | POA: Diagnosis not present

## 2015-07-03 MED ORDER — QUETIAPINE FUMARATE 200 MG PO TABS: 200.0000 mg | ORAL_TABLET | Freq: Every day | ORAL | Status: DC

## 2015-07-03 NOTE — Progress Notes (Signed)
Patient ID: Zachary Reynolds, male   DOB: 1956-02-22, 60 y.o.   MRN: [id number] [facility] MD/PA/NP OP Progress Note  07/03/2015 2:11 PM Zachary Reynolds  MRN:  [id number]  Subjective:  Patient returns for follow-up of his major depressive disorder, recurrent, chronic. Patient was previously seen by Dr. Jimmye Norman and this is the first visit for this patient with this clinician. Patient walks and slowly into this clinician's office. He reports that he has been under stress. States that his stressors are his back pain and also caring for his elderly parents who he has to take them to appointments. Discussed his medications and patient reports that his been on several antidepressants in the past and have not worked for him. States that his Xanax that he takes 1 mg 4 times daily helps him. He also reports some tremors that are held by the Xanax. He is also taking oxycodone and gabapentin from different doctors. Was discussed with him that Xanax is not a form of medication that his use long-term to treat anxiety. Stated to him that he would need to be tapered off it if he needs to continue care with this clinician. Patient states that he does not want to do that. However he is cooperative and polite. Denies any suicidal thoughts. States he sleeps a lot mostly in his arm chair. Eating well. Reports having some regrets about his sexual orientation when he was much younger. She is currently living alone and states that his been single for so long and cannot think of relationship at this point.   Chief Complaint: Stressed Optician, dispensing    Anxiety; Follow-up; Medication Refill     Visit Diagnosis:     ICD-9-CM ICD-10-CM   1. Major depression, recurrent, chronic ([facility]) 296.30 F33.9     Past Medical History:  Past Medical History  Diagnosis Date  . Renal disorder   . Slow urinary stream   . Hepatitis   . Constipation   . Cancer of right kidney ([place])   . Bipolar depression ([place])   . Chronic back pain   . ED (erectile  dysfunction)   . Hematuria     gross  . Hypothyroidism   . Nephrolithiasis   . Elevated serum creatinine   . Testicular/scrotal pain   . Malaise   . BPH with obstruction/lower urinary tract symptoms   . Anxiety   . Arrhythmia   . Weak urinary stream     Past Surgical History  Procedure Laterality Date  . Tonsillectomy    . Hernia repair Bilateral   . Nephrectomy Right    Family History:  Family History  Problem Relation Age of Onset  . Prostate cancer Brother   . Thyroid disease Brother   . Stroke Mother   . Hypertension Mother   . Thyroid disease Mother   . Macular degeneration Father   . Thyroid disease Sister   . Thyroid disease Sister    Social History:  Social History   Social History  . Marital Status: Single    Spouse Name: N/A  . Number of Children: N/A  . Years of Education: N/A   Social History Main Topics  . Smoking status: Current Every Day Smoker    Types: Cigarettes    Start date: 12/26/1974  . Smokeless tobacco: Never Used  . Alcohol Use: No  . Drug Use: No  . Sexual Activity: Not Currently   Other Topics Concern  . None   Social History Narrative   Additional History:  Assessment:   Musculoskeletal: Strength & Muscle Tone: within normal limits Gait & Station: normal Patient leans: N/A  Psychiatric Specialty Exam: Anxiety Symptoms include insomnia (responds to some extent to the Seroquel).    Depression        Associated symptoms include insomnia (responds to some extent to the Seroquel).  Past medical history includes anxiety.     Review of Systems  Constitutional: Negative.   HENT: Negative.   Eyes: Negative.   Respiratory: Negative.   Cardiovascular: Negative.   Gastrointestinal: Negative.   Genitourinary: Negative.   Musculoskeletal: Negative.   Skin: Negative.   Neurological: Negative.   Endo/Heme/Allergies: Negative.   Psychiatric/Behavioral: Positive for depression (as has largely remained at baseline and not  worsened). The patient has insomnia (responds to some extent to the Seroquel).   All other systems reviewed and are negative.   Blood pressure 120/82, pulse 85, temperature 98.6 F (37 C), temperature source Tympanic, height 6\' 2"  (1.88 m), weight 234 lb 9.6 oz (106.414 kg), SpO2 93 %.Body mass index is 30.11 kg/(m^2).  General Appearance: Well Groomed  Eye Contact:  Good  Speech:  Slightly slow rate  Volume:  Normal  Mood:  Depressed  Affect:  Constricted  Thought Process:  Logical  Orientation:  Full (Time, Place, and Person)  Thought Content:  Negative  Suicidal Thoughts:  No  Homicidal Thoughts:  No  Memory:  Immediate;   Good Recent;   Good Remote;   Good  Judgement:  Good  Insight:  Good  Psychomotor Activity:  Negative  Concentration:  Good  Recall:  Good  Fund of Knowledge: Good  Language: Good  Akathisia:  Negative  Handed:    AIMS (if indicated):  Done 12/25/2013 and was normal  Assets:  Communication Skills Desire for Improvement  ADL's:  Intact  Cognition: WNL  Sleep:  poor   Is the patient at risk to self?  No. Has the patient been a risk to self in the past 6 months?  No. Has the patient been a risk to self within the distant past?  No. Is the patient a risk to others?  No. Has the patient been a risk to others in the past 6 months?  No. Has the patient been a risk to others within the distant past?  No.  Current Medications: Current Outpatient Prescriptions  Medication Sig Dispense Refill  . ALPRAZolam (XANAX) 1 MG tablet Take 1 tablet (1 mg total) by mouth 4 (four) times daily as needed for anxiety. 120 tablet 2  . fenofibrate 160 MG tablet     . gabapentin (NEURONTIN) 300 MG capsule     . levothyroxine (SYNTHROID, LEVOTHROID) 88 MCG tablet     . metoprolol succinate (TOPROL-XL) 50 MG 24 hr tablet     . oxyCODONE (ROXICODONE) 15 MG immediate release tablet     . QUEtiapine (SEROQUEL) 200 MG tablet Take 1 tablet (200 mg total) by mouth at bedtime. 30  tablet 4   No current facility-administered medications for this visit.    Medical Decision Making:  Established Problem, Stable/Improving (1), New Problem, with no additional work-up planned (3), Review of Medication Regimen & Side Effects (2) and Review of New Medication or Change in Dosage (2)  Treatment Plan Summary:Medication management and Plan   Major depressive disorder, recurrent severe without psychotic features  Continue Seroquel at 200 mg at bedtime. Patient reports being on multiple antidepressants in the past and that none have helped him and have caused side  effects. Recommend that he taper off the Xanax which she takes at 1 mg 4 times daily. And is not interested in doing this. He was educated about benzodiazepines not being a long-term solution for anxiety and that they are to be used as short-term management of anxiety. He was told that this clinician can work with him if he is willing to taper off the medication since it can also lead to increased risk of falls, sedation and memory impairment. Patient would rather see a different clinician and continue at his current dosage. Patient has medication that will last him for another 30 days, given refills of the Seroquel. Patient will not return to this clinic and will be looking for a psychiatrist in [place]. He was informed that we would send his records one he identifies a psychiatrist that he would like to see. Patient was appreciative of the time spent with him today.  Vera Wishart 07/03/2015, 2:11 PM

## 2015-07-11 ENCOUNTER — Ambulatory Visit: Payer: Medicare Other | Admitting: Psychiatry

## 2015-07-19 ENCOUNTER — Encounter: Payer: Self-pay | Admitting: Emergency Medicine

## 2015-07-19 ENCOUNTER — Emergency Department
Admission: EM | Admit: 2015-07-19 | Discharge: 2015-07-19 | Disposition: A | Discharge status: Home / Self Care | Payer: Medicare Other | Attending: Emergency Medicine | Admitting: Emergency Medicine

## 2015-07-19 DIAGNOSIS — G8929 Other chronic pain: Secondary | ICD-10-CM | POA: Diagnosis not present

## 2015-07-19 DIAGNOSIS — E039 Hypothyroidism, unspecified: Secondary | ICD-10-CM | POA: Insufficient documentation

## 2015-07-19 DIAGNOSIS — Z85528 Personal history of other malignant neoplasm of kidney: Secondary | ICD-10-CM | POA: Insufficient documentation

## 2015-07-19 DIAGNOSIS — F17210 Nicotine dependence, cigarettes, uncomplicated: Secondary | ICD-10-CM | POA: Diagnosis not present

## 2015-07-19 DIAGNOSIS — M5442 Lumbago with sciatica, left side: Secondary | ICD-10-CM | POA: Diagnosis not present

## 2015-07-19 DIAGNOSIS — M545 Low back pain: Secondary | ICD-10-CM | POA: Diagnosis present

## 2015-07-19 DIAGNOSIS — M5441 Lumbago with sciatica, right side: Secondary | ICD-10-CM | POA: Insufficient documentation

## 2015-07-19 MED ORDER — OXYCODONE HCL 5 MG PO TABS
30.0000 mg | ORAL_TABLET | Freq: Once | ORAL | Status: AC
  Administered 2015-07-19: 30 mg via ORAL
  Filled 2015-07-19: qty 6

## 2015-07-19 NOTE — ED Notes (Signed)
Patient states a few weeks ago patient took his father's hydrocodone prior to his pain clinic appointment and he showed positive for this and the pain management clinic sent him to counseling for this.  Patient states he saw a counselor who stated he did not need further counseling, that patient could go back to pain management but patient has not been able to get through on the telephone. This RN attempted to call the [facility] pain management clinic to attempt to make an appointment for patient and was put to voicemail.

## 2015-07-19 NOTE — ED Notes (Signed)
Patient presents to the ED with a chronic back pain exacerbation since Tuesday of this week.  Patient states he ran out of his medication on Tuesday.  Patient states his pcp is Dr. Kary Kos.  Patient states he goes to the pain management clinic.  Patient states he missed his last appointment and their phone line has been busy so he hasn't been able to make a new appointment.

## 2015-07-19 NOTE — ED Notes (Signed)
States he has chronic back pain and goes to pain clinic in [place] and missed his appt  Now out of meds

## 2015-07-19 NOTE — Discharge Instructions (Signed)

## 2015-07-19 NOTE — ED Provider Notes (Signed)
[facility] Emergency Department Provider Note  ____________________________________________  Time seen: Approximately 4:34 PM  I have reviewed the triage vital signs and the nursing notes.   HISTORY  Chief Complaint Back Pain    HPI Zachary Reynolds is a 60 y.o. male who presents emergency Department with complaint of chronic back pain. Patient states that he sees pain management in [place] and missed his weekly appointment for pain medication. Patient states that recently he was having an acute exacerbation and took one of his father's Vicodin. When he returns to the pain management he was tested and Vicodin was out of his system. The patient was sent to counseling and per patient he was cleared to return back to pain management. Patient states that due to the process of counseling he was unable to attend his weekly appointment. Patient is requesting pain medication at this time until he can contact pain management tomorrow. Nursing staff here is tried to reach pain management but only reaches voice mail as well. Patient denies it any change in his typical symptoms. 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 Patient denies any bowel or bladder dysfunction, saddle anesthesia, paresthesias.   Past Medical History  Diagnosis Date  . Renal disorder   . Slow urinary stream   . Hepatitis   . Constipation   . Cancer of right kidney ([place])   . Bipolar depression ([place])   . Chronic back pain   . ED (erectile dysfunction)   . Hematuria     gross  . Hypothyroidism   . Nephrolithiasis   . Elevated serum creatinine   . Testicular/scrotal pain   . Malaise   . BPH with obstruction/lower urinary tract symptoms   . Anxiety   . Arrhythmia   . Weak urinary stream     Patient Active Problem List   Diagnosis Date Noted  . Difficulty sleeping 05/01/2014  . 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 Has a tremor 02/22/2014     Past Surgical History  Procedure Laterality Date  . Tonsillectomy    . Hernia repair Bilateral   . Nephrectomy Right     Current Outpatient Rx  Name  Route  Sig  Dispense  Refill  . ALPRAZolam (XANAX) 1 MG tablet   Oral   Take 1 tablet (1 mg total) by mouth 4 (four) times daily as needed for anxiety.   120 tablet   2   . fenofibrate 160 MG tablet               . gabapentin (NEURONTIN) 300 MG capsule               . levothyroxine (SYNTHROID, LEVOTHROID) 88 MCG tablet               . metoprolol succinate (TOPROL-XL) 50 MG 24 hr tablet               . oxyCODONE (ROXICODONE) 15 MG immediate release tablet               . QUEtiapine (SEROQUEL) 200 MG tablet   Oral   Take 1 tablet (200 mg total) by mouth at bedtime.   30 tablet   4     Allergies Amlodipine; Haloperidol; Nucynta; and Prednisone  Family History  Problem Relation Age of Onset  . Prostate cancer Brother   . Thyroid disease Brother   . Stroke Mother   . Hypertension Mother   . Thyroid disease  Mother   . Macular degeneration Father   . Thyroid disease Sister   . Thyroid disease Sister     Social History Social History  Substance Use Topics  . Smoking status: Current Every Day Smoker    Types: Cigarettes    Start date: 12/26/1974  . Smokeless tobacco: Never Used  . Alcohol Use: No     Review of Systems  Constitutional: No fever/chills Cardiovascular: no chest pain. Respiratory: no cough. No SOB. Musculoskeletal: Positive for chronic low back pain. Positive for bilateral radiculopathy. Skin: Negative for rash. Neurological: Negative for headaches, focal weakness or numbness. 10-point ROS otherwise negative.  ____________________________________________   PHYSICAL EXAM:  VITAL SIGNS: ED Triage Vitals  Enc Vitals Group     BP 07/19/15 1537 158/92 mmHg     Pulse Rate 07/19/15 1537 72     Resp 07/19/15 1537 18     Temp 07/19/15 1537 98.7 F (37.1 C)     Temp  Source 07/19/15 1537 Oral     SpO2 07/19/15 1537 98 %     Weight 07/19/15 1537 234 lb (106.142 kg)     Height 07/19/15 1537 6\' 2"  (1.88 m)     Head Cir --      Peak Flow --      Pain Score 07/19/15 1537 9     Pain Loc --      Pain Edu? --      Excl. in [place]? --      Constitutional: Alert and oriented. Well appearing and in no acute distress. Eyes: Conjunctivae are normal. PERRL. EOMI. Head: Atraumatic. Cardiovascular: Normal rate, regular rhythm. Normal S1 and S2.  Good peripheral circulation. Respiratory: Normal respiratory effort without tachypnea or retractions. Lungs CTAB. Gastrointestinal: Soft and nontender. No distention. No CVA tenderness. Musculoskeletal: No visible deformity visualized to inspection of spine. Patient is nontender to palpation. Positive straight leg raise bilaterally. Patient endorses mildly decreased sensation and both lower extremities but states that this is "normal" per him. No changes from baseline. Dorsalis pedis pulses appreciated bilaterally. Neurologic:  Normal speech and language. No gross focal neurologic deficits are appreciated.  Skin:  Skin is warm, dry and intact. No rash noted. Psychiatric: Mood and affect are normal. Speech and behavior are normal. Patient exhibits appropriate insight and judgement.   ____________________________________________   LABS (all labs ordered are listed, but only abnormal results are displayed)  Labs Reviewed - No data to display ____________________________________________  EKG   ____________________________________________  RADIOLOGY   No results found.  ____________________________________________    PROCEDURES  Procedure(s) performed:       Medications  oxyCODONE (Oxy IR/ROXICODONE) immediate release tablet 30 mg (not administered)     ____________________________________________   INITIAL IMPRESSION / ASSESSMENT AND PLAN / ED COURSE  Pertinent labs & imaging results that were  available during my care of the patient were reviewed by me and considered in my medical decision making (see chart for details).  Patient's diagnosis is consistent with chronic lumbago with sciatica. Patient is given pain medication in the emergency department but advised that we would not fill pain medication since he has a pain management contract.Patient receives 28 oxycodone 15 mg tablets every week. Patient takes 1 tablet in the morning, 2 tablets in the evening, and 1 tablet before bed. Patient was given 30 mg here in the emergency department. Patient verbalizes understanding of this treatment plan and that he cannot receive prescriptions from the emergency department, he states that he will contact the  clinic tomorrow for an appointment for refill of pain medication.. Patient is given ED precautions to return to the ED for any worsening or new symptoms.     ____________________________________________  FINAL CLINICAL IMPRESSION(S) / ED DIAGNOSES  Final diagnoses:  Chronic midline low back pain with bilateral sciatica      NEW MEDICATIONS STARTED DURING THIS VISIT:  New Prescriptions   No medications on file        This chart was dictated using voice recognition software/Dragon. Despite best efforts to proofread, errors can occur which can change the meaning. Any change was purely unintentional.    Darletta Moll, PA-C 07/19/15 1656  Daymon Larsen, MD 07/19/15 1728

## 2015-07-20 ENCOUNTER — Emergency Department
Admission: EM | Admit: 2015-07-20 | Discharge: 2015-07-20 | Disposition: A | Discharge status: Home / Self Care | Payer: Medicare Other | Attending: Emergency Medicine | Admitting: Emergency Medicine

## 2015-07-20 ENCOUNTER — Encounter: Payer: Self-pay | Admitting: Emergency Medicine

## 2015-07-20 DIAGNOSIS — G8929 Other chronic pain: Secondary | ICD-10-CM | POA: Insufficient documentation

## 2015-07-20 DIAGNOSIS — M545 Low back pain: Secondary | ICD-10-CM | POA: Diagnosis present

## 2015-07-20 DIAGNOSIS — E039 Hypothyroidism, unspecified: Secondary | ICD-10-CM | POA: Diagnosis not present

## 2015-07-20 DIAGNOSIS — M5442 Lumbago with sciatica, left side: Secondary | ICD-10-CM | POA: Insufficient documentation

## 2015-07-20 DIAGNOSIS — Z79899 Other long term (current) drug therapy: Secondary | ICD-10-CM | POA: Diagnosis not present

## 2015-07-20 DIAGNOSIS — F17210 Nicotine dependence, cigarettes, uncomplicated: Secondary | ICD-10-CM | POA: Insufficient documentation

## 2015-07-20 DIAGNOSIS — Z87448 Personal history of other diseases of urinary system: Secondary | ICD-10-CM | POA: Diagnosis not present

## 2015-07-20 DIAGNOSIS — Z85528 Personal history of other malignant neoplasm of kidney: Secondary | ICD-10-CM | POA: Insufficient documentation

## 2015-07-20 DIAGNOSIS — F3130 Bipolar disorder, current episode depressed, mild or moderate severity, unspecified: Secondary | ICD-10-CM | POA: Insufficient documentation

## 2015-07-20 DIAGNOSIS — Z8679 Personal history of other diseases of the circulatory system: Secondary | ICD-10-CM | POA: Diagnosis not present

## 2015-07-20 DIAGNOSIS — M5441 Lumbago with sciatica, right side: Secondary | ICD-10-CM | POA: Insufficient documentation

## 2015-07-20 NOTE — ED Notes (Signed)
See triage note  States he missed his appt with pain management in [place] now having a lot of back pain. States he has left messages with pain clinic to reset appt time they have not returned his calls.Marland Kitchen

## 2015-07-20 NOTE — ED Provider Notes (Signed)
[facility] Emergency Department Provider Note  ____________________________________________  Time seen: Approximately 3:24 PM  I have reviewed the triage vital signs and the nursing notes.   HISTORY  Chief Complaint Back Pain    HPI Zachary Reynolds is a 60 y.o. male who presents to the emergency department via EMS for complaint of chronic back pain. Patient was seen in this department yesterday by myself for the same complaint. Patient does have a pain management contract but missed his appointment earlier this week. He states that he is tried to call pain management but only receives a voicemail and they have not called back. Yesterday the nursing staff tried to reach pain management and also received voicemail. Patient did receive 1 dose of pain medicine yesterday but was informed that he cannot return to the emergency department for this complaint and received pain medication. Patient was not written a prescription for narcotics. Patient reports to the emergency department today stating that "I had a breakdown and cannot go to pain management today." Patient reports that the pain was so severe he called EMS for transport. Patient denies any change in the type of pain that he is experiencing just reports an increase in pain since he is not on pain management. Patient denies any bowel or bladder dysfunction, saddle anesthesia, paresthesias.   Past Medical History  Diagnosis Date  . Renal disorder   . Slow urinary stream   . Hepatitis   . Constipation   . Cancer of right kidney ([place])   . Bipolar depression ([place])   . Chronic back pain   . ED (erectile dysfunction)   . Hematuria     gross  . Hypothyroidism   . Nephrolithiasis   . Elevated serum creatinine   . Testicular/scrotal pain   . Malaise   . BPH with obstruction/lower urinary tract symptoms   . Anxiety   . Arrhythmia   . Weak urinary stream     Patient Active Problem List   Diagnosis Date Noted   . Difficulty sleeping 05/01/2014  . 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 Has a tremor 02/22/2014    Past Surgical History  Procedure Laterality Date  . Tonsillectomy    . Hernia repair Bilateral   . Nephrectomy Right     Current Outpatient Rx  Name  Route  Sig  Dispense  Refill  . ALPRAZolam (XANAX) 1 MG tablet   Oral   Take 1 tablet (1 mg total) by mouth 4 (four) times daily as needed for anxiety.   120 tablet   2   . fenofibrate 160 MG tablet               . gabapentin (NEURONTIN) 300 MG capsule               . levothyroxine (SYNTHROID, LEVOTHROID) 88 MCG tablet               . metoprolol succinate (TOPROL-XL) 50 MG 24 hr tablet               . oxyCODONE (ROXICODONE) 15 MG immediate release tablet               . QUEtiapine (SEROQUEL) 200 MG tablet   Oral   Take 1 tablet (200 mg total) by mouth at bedtime.   30 tablet   4     Allergies Amlodipine; Haloperidol; Nucynta; and Prednisone  Family History  Problem Relation Age of Onset  . Prostate cancer Brother   .  Thyroid disease Brother   . Stroke Mother   . Hypertension Mother   . Thyroid disease Mother   . Macular degeneration Father   . Thyroid disease Sister   . Thyroid disease Sister     Social History Social History  Substance Use Topics  . Smoking status: Current Every Day Smoker    Types: Cigarettes    Start date: 12/26/1974  . Smokeless tobacco: Never Used  . Alcohol Use: No     Review of Systems  Constitutional: No fever/chills Cardiovascular: no chest pain. Respiratory: no cough. No SOB. Musculoskeletal: Positive for chronic lower back pain. Skin: Negative for rash. Neurological: Negative for headaches, focal weakness or numbness. 10-point ROS otherwise negative.  ____________________________________________   PHYSICAL EXAM:  VITAL SIGNS: ED Triage Vitals  Enc Vitals Group     BP 07/20/15 1410 151/89 mmHg     Pulse Rate 07/20/15 1410 79     Resp 07/20/15  1410 20     Temp 07/20/15 1410 97 F (36.1 C)     Temp Source 07/20/15 1410 Oral     SpO2 07/20/15 1410 97 %     Weight 07/20/15 1410 234 lb (106.142 kg)     Height 07/20/15 1410 6\' 2"  (1.88 m)     Head Cir --      Peak Flow --      Pain Score 07/20/15 1410 10     Pain Loc --      Pain Edu? --      Excl. in [place]? --      Constitutional: Alert and oriented. Well appearing and in no acute distress. Eyes: Conjunctivae are normal. PERRL. EOMI. Head: Atraumatic. Neck: No stridor.  No cervical spine tenderness to palpation. Hematological/Lymphatic/Immunilogical: No cervical lymphadenopathy. Cardiovascular: Normal rate, regular rhythm. Normal S1 and S2.  Good peripheral circulation. Respiratory: Normal respiratory effort without tachypnea or retractions. Lungs CTAB. Musculoskeletal: No visible deformity to spine upon inspection. Patient is diffusely tender to palpation in the lumbar region both over spinal processes and lumbar paraspinal muscles. No palpable abnormality. No tenderness to palpation over bilateral sciatic notches. Positive straight leg raise bilaterally. Patient does have full range of motion to lower extremities. Sensation intact and equal woke her extremities. Dorsalis pedis pulses appreciated bilaterally. Neurologic:  Normal speech and language. No gross focal neurologic deficits are appreciated.  Skin:  Skin is warm, dry and intact. No rash noted. Psychiatric: Mood and affect are normal. Speech and behavior are normal. Patient exhibits appropriate insight and judgement.   ____________________________________________   LABS (all labs ordered are listed, but only abnormal results are displayed)  Labs Reviewed - No data to display ____________________________________________  EKG   ____________________________________________  RADIOLOGY   No results found.  ____________________________________________    PROCEDURES  Procedure(s) performed:        Medications - No data to display   ____________________________________________   INITIAL IMPRESSION / ASSESSMENT AND PLAN / ED COURSE  Pertinent labs & imaging results that were available during my care of the patient were reviewed by me and considered in my medical decision making (see chart for details).  Patient's diagnosis is consistent with chronic lumbago with radicular symptoms. Patient is on pain management contract but missed his appointment this week. Patient was seen in this department yesterday and given 1 dose of pain medicine here but told that he would not be prescribed any pain medicine at home. Patient was informed that it is against our policy to refill chronic pain  medications. Patient had verbalized understanding of this time. Patient returns to the department today for continued complaint of pain. The patient was to follow up with pain management clinic today but did not report to the clinic. Patient is informed of this time that he will not receive pain medication here and will not receive a prescription for pain medicine. Patient verbalizes understanding with hospital policy and states that he will in fact follow-up with pain management.. Patient is given ED precautions to return to the ED for any worsening or new symptoms.     ____________________________________________  FINAL CLINICAL IMPRESSION(S) / ED DIAGNOSES  Final diagnoses:  Chronic midline low back pain with bilateral sciatica      NEW MEDICATIONS STARTED DURING THIS VISIT:  Discharge Medication List as of 07/20/2015  3:32 PM          This chart was dictated using voice recognition software/Dragon. Despite best efforts to proofread, errors can occur which can change the meaning. Any change was purely unintentional.    Darletta Moll, PA-C 07/20/15 [place], MD 07/21/15 [phone]

## 2015-07-20 NOTE — Discharge Instructions (Signed)

## 2015-07-20 NOTE — ED Notes (Signed)
Pt verbalized understanding of discharge instructions. NAD at this time. 

## 2015-07-20 NOTE — ED Notes (Signed)
Pt to ed with c/o back pain and bilat leg pain.  Pt brought in by ems.  Pt states he was seen here yesterday for same, was supposed to call pain management clinic today for appt but called ems because he states "I had a break down and could not go on". Pt states "I can't stand this pain, it is too much, I have never called the ambulance for pain before"

## 2015-08-01 ENCOUNTER — Encounter: Payer: Self-pay | Admitting: Psychiatry

## 2015-08-01 ENCOUNTER — Ambulatory Visit (INDEPENDENT_AMBULATORY_CARE_PROVIDER_SITE_OTHER): Payer: 59 | Admitting: Psychiatry

## 2015-08-01 VITALS — BP 128/84 | HR 103 | Temp 98.3°F | Ht 74.0 in | Wt 229.8 lb

## 2015-08-01 DIAGNOSIS — F339 Major depressive disorder, recurrent, unspecified: Secondary | ICD-10-CM

## 2015-08-01 MED ORDER — FLUOXETINE HCL 20 MG PO CAPS: 20.0000 mg | ORAL_CAPSULE | Freq: Every day | ORAL | Status: DC

## 2015-08-01 MED ORDER — ALPRAZOLAM 1 MG PO TABS: ORAL_TABLET | ORAL | Status: DC

## 2015-08-01 NOTE — Progress Notes (Signed)
Patient ID: Zachary Reynolds, male   DOB: 1955-11-17, 60 y.o.   MRN: [id number]  [facility] MD/PA/NP OP Progress Note  08/01/2015 3:57 PM Zachary Reynolds  MRN:  [id number]  Subjective:  Patient returns for follow-up of his major depressive disorder, recurrent, chronic. At his previous visit patient was not ready to taper off the Xanax. Today he reports that he had seen his primary care physician who convinced patient to taper off the Xanax and get treated for his depression and anxiety. Patient states that he is okay coming off Xanax but states he has been very depressed. States that he is not the person he used to be. States he has a lot of stress from taking care of his elderly parents. Also reports having this tremor in his hand that causes him to not be able to cook and other finer movements. Patient denies active suicidal thoughts but states that he does feel like he does not want to be here anymore. States he was on a lot of medications previously but he is willing to try something again. We discussed starting either Zoloft or Prozac and patient would like to start on Prozac. States he sleeps with the help of the Seroquel.  Chief Complaint: Depression Chief Complaint    Follow-up; Medication Refill     Visit Diagnosis:     ICD-9-CM ICD-10-CM   1. Major depression, recurrent, chronic (HCC) 296.30 F33.9     Past Medical History:  Past Medical History  Diagnosis Date  . Renal disorder   . Slow urinary stream   . Hepatitis   . Constipation   . Cancer of right kidney ([place])   . Bipolar depression ([place])   . Chronic back pain   . ED (erectile dysfunction)   . Hematuria     gross  . Hypothyroidism   . Nephrolithiasis   . Elevated serum creatinine   . Testicular/scrotal pain   . Malaise   . BPH with obstruction/lower urinary tract symptoms   . Anxiety   . Arrhythmia   . Weak urinary stream     Past Surgical History  Procedure Laterality Date  . Tonsillectomy    . Hernia repair Bilateral   .  Nephrectomy Right    Family History:  Family History  Problem Relation Age of Onset  . Prostate cancer Brother   . Thyroid disease Brother   . Stroke Mother   . Hypertension Mother   . Thyroid disease Mother   . Macular degeneration Father   . Thyroid disease Sister   . Thyroid disease Sister    Social History:  Social History   Social History  . Marital Status: Single    Spouse Name: N/A  . Number of Children: N/A  . Years of Education: N/A   Social History Main Topics  . Smoking status: Current Every Day Smoker    Types: Cigarettes    Start date: 12/26/1974  . Smokeless tobacco: Never Used  . Alcohol Use: No  . Drug Use: No  . Sexual Activity: Not Currently   Other Topics Concern  . None   Social History Narrative   Additional History:   Assessment:   Musculoskeletal: Strength & Muscle Tone: within normal limits Gait & Station: normal Patient leans: N/A  Psychiatric Specialty Exam: Anxiety Symptoms include insomnia (responds to some extent to the Seroquel).    Depression        Associated symptoms include insomnia (responds to some extent to the Seroquel).  Past  medical history includes anxiety.     Review of Systems  Constitutional: Negative.   HENT: Negative.   Eyes: Negative.   Respiratory: Negative.   Cardiovascular: Negative.   Gastrointestinal: Negative.   Genitourinary: Negative.   Musculoskeletal: Negative.   Skin: Negative.   Neurological: Negative.   Endo/Heme/Allergies: Negative.   Psychiatric/Behavioral: Positive for depression (as has largely remained at baseline and not worsened). The patient has insomnia (responds to some extent to the Seroquel).   All other systems reviewed and are negative.   Blood pressure 128/84, pulse 103, temperature 98.3 F (36.8 C), temperature source Tympanic, height 6\' 2"  (1.88 m), weight 229 lb 12.8 oz (104.237 kg), SpO2 96 %.Body mass index is 29.49 kg/(m^2).  General Appearance: Well Groomed  Eye  Contact:  Good  Speech:  Slightly slow rate  Volume:  Normal  Mood:  Depressed  Affect:  Constricted  Thought Process:  Logical  Orientation:  Full (Time, Place, and Person)  Thought Content:  Negative  Suicidal Thoughts:  No  Homicidal Thoughts:  No  Memory:  Immediate;   Good Recent;   Good Remote;   Good  Judgement:  Good  Insight:  Good  Psychomotor Activity:  Negative  Concentration:  Good  Recall:  Good  Fund of Knowledge: Good  Language: Good  Akathisia:  Negative  Handed:    AIMS (if indicated):    Assets:  Communication Skills Desire for Improvement  ADL's:  Intact  Cognition: WNL  Sleep:  Okay with Seroquel    Is the patient at risk to self?  No. Has the patient been a risk to self in the past 6 months?  No. Has the patient been a risk to self within the distant past?  No. Is the patient a risk to others?  No. Has the patient been a risk to others in the past 6 months?  No. Has the patient been a risk to others within the distant past?  No.  Current Medications: Current Outpatient Prescriptions  Medication Sig Dispense Refill  . ALPRAZolam (XANAX) 1 MG tablet Take 1 mg twice daily by mouth for 2 weeks and then decrease to 1 mg daily for 2 weeks. 45 tablet 0  . fenofibrate 160 MG tablet     . gabapentin (NEURONTIN) 300 MG capsule     . levothyroxine (SYNTHROID, LEVOTHROID) 88 MCG tablet     . metoprolol succinate (TOPROL-XL) 50 MG 24 hr tablet     . oxyCODONE (ROXICODONE) 15 MG immediate release tablet     . QUEtiapine (SEROQUEL) 200 MG tablet Take 1 tablet (200 mg total) by mouth at bedtime. 30 tablet 4  . FLUoxetine (PROZAC) 20 MG capsule Take 1 capsule (20 mg total) by mouth daily. 30 capsule 1   No current facility-administered medications for this visit.    Medical Decision Making:  Established Problem, Stable/Improving (1), New Problem, with no additional work-up planned (3), Review of Medication Regimen & Side Effects (2) and Review of New Medication  or Change in Dosage (2)  Treatment Plan Summary:Medication management and Plan   Major depressive disorder, recurrent severe without psychotic features  Continue Seroquel at 200 mg at bedtime. Start Prozac at 20 mg once daily, patient made aware of the side effects and benefits Taper Xanax to 1 mg twice daily for 2 weeks and then decrease to 1 mg daily for 2 weeks. Patient given this prescription with 45 tablets. Discussed that patient would benefit from seeing a therapist but he  states that he has seen a therapist in the past and does not want to do that at this time Return to clinic in 2 weeks time or call before if necessary   Tesneem Dufrane 08/01/2015, 3:57 PM

## 2015-08-15 ENCOUNTER — Encounter: Payer: Self-pay | Admitting: Psychiatry

## 2015-08-15 ENCOUNTER — Ambulatory Visit (INDEPENDENT_AMBULATORY_CARE_PROVIDER_SITE_OTHER): Payer: 59 | Admitting: Psychiatry

## 2015-08-15 VITALS — BP 142/90 | HR 73 | Temp 97.2°F | Ht 74.0 in | Wt 230.2 lb

## 2015-08-15 DIAGNOSIS — F339 Major depressive disorder, recurrent, unspecified: Secondary | ICD-10-CM

## 2015-08-15 MED ORDER — SERTRALINE HCL 25 MG PO TABS: 25.0000 mg | ORAL_TABLET | Freq: Every day | ORAL | Status: DC

## 2015-08-15 MED ORDER — BENZTROPINE MESYLATE 0.5 MG PO TABS: 0.5000 mg | ORAL_TABLET | Freq: Every day | ORAL | Status: DC

## 2015-08-15 NOTE — Progress Notes (Signed)
Patient ID: Zachary Reynolds, male   DOB: 1955-06-01, 60 y.o.   MRN: [id number]  [facility] MD/PA/NP OP Progress Note  08/15/2015 2:45 PM Zachary Reynolds  MRN:  [id number]  Subjective:  Patient returns for follow-up of his major depressive disorder, recurrent, chronic. Patient states he was unable to tolerate the Prozac , it caused stomach upset. States being quite depressed over past few weeks. States today is unusual that he is feeling energetic. States he was able to taper the xanax to 3 tablets daily and is working to decrease to 2mg  daily. States he has medication left from Dr.Williams prescription. He is sleeping okay and trying to eat well. Denies any suicidal thoughts. Patient states that his depression probably stems from his frustration at being unable to work and not having a structure to his day. States that he worked for a long time in [employer] at for [employer] and carried on heavy loads. Has been unable to work over the last 6 years due to his back pain.  Chief Complaint: Depression Chief Complaint    Follow-up; Medication Refill     Visit Diagnosis:     ICD-9-CM ICD-10-CM   1. Major depression, recurrent, chronic (HCC) 296.30 F33.9     Past Medical History:  Past Medical History  Diagnosis Date  . Renal disorder   . Slow urinary stream   . Hepatitis   . Constipation   . Cancer of right kidney ([place])   . Bipolar depression ([place])   . Chronic back pain   . ED (erectile dysfunction)   . Hematuria     gross  . Hypothyroidism   . Nephrolithiasis   . Elevated serum creatinine   . Testicular/scrotal pain   . Malaise   . BPH with obstruction/lower urinary tract symptoms   . Anxiety   . Arrhythmia   . Weak urinary stream     Past Surgical History  Procedure Laterality Date  . Tonsillectomy    . Hernia repair Bilateral   . Nephrectomy Right    Family History:  Family History  Problem Relation Age of Onset  . Prostate cancer Brother   . Thyroid disease Brother   . Stroke  Mother   . Hypertension Mother   . Thyroid disease Mother   . Macular degeneration Father   . Thyroid disease Sister   . Thyroid disease Sister    Social History:  Social History   Social History  . Marital Status: Single    Spouse Name: N/A  . Number of Children: N/A  . Years of Education: N/A   Social History Main Topics  . Smoking status: Current Every Day Smoker    Types: Cigarettes    Start date: 12/26/1974  . Smokeless tobacco: Never Used  . Alcohol Use: No  . Drug Use: No  . Sexual Activity: Not Currently   Other Topics Concern  . None   Social History Narrative   Additional History:   Assessment:   Musculoskeletal: Strength & Muscle Tone: within normal limits Gait & Station: normal Patient leans: N/A  Psychiatric Specialty Exam: Anxiety Symptoms include insomnia (responds to some extent to the Seroquel).    Depression        Associated symptoms include insomnia (responds to some extent to the Seroquel).  Past medical history includes anxiety.     Review of Systems  Constitutional: Negative.   HENT: Negative.   Eyes: Negative.   Respiratory: Negative.   Cardiovascular: Negative.   Gastrointestinal: Negative.  Genitourinary: Negative.   Musculoskeletal: Negative.   Skin: Negative.   Neurological: Negative.   Endo/Heme/Allergies: Negative.   Psychiatric/Behavioral: Positive for depression (as has largely remained at baseline and not worsened). The patient has insomnia (responds to some extent to the Seroquel).   All other systems reviewed and are negative.   Blood pressure 142/90, pulse 73, temperature 97.2 F (36.2 C), temperature source Tympanic, height 6\' 2"  (1.88 m), weight 230 lb 3.2 oz (104.418 kg), SpO2 96 %.Body mass index is 29.54 kg/(m^2).  General Appearance: Well Groomed  Eye Contact:  Good  Speech:  Slightly slow rate  Volume:  Normal  Mood:  Depressed  Affect:  Constricted  Thought Process:  Logical  Orientation:  Full (Time,  Place, and Person)  Thought Content:  Negative  Suicidal Thoughts:  No  Homicidal Thoughts:  No  Memory:  Immediate;   Good Recent;   Good Remote;   Good  Judgement:  Good  Insight:  Good  Psychomotor Activity:  Negative  Concentration:  Good  Recall:  Good  Fund of Knowledge: Good  Language: Good  Akathisia:  Negative  Handed:    AIMS (if indicated):  Cogwheeling rigidity present in both arms   Assets:  Communication Skills Desire for Improvement  ADL's:  Intact  Cognition: WNL  Sleep:  Okay with Seroquel    Is the patient at risk to self?  No. Has the patient been a risk to self in the past 6 months?  No. Has the patient been a risk to self within the distant past?  No. Is the patient a risk to others?  No. Has the patient been a risk to others in the past 6 months?  No. Has the patient been a risk to others within the distant past?  No.  Current Medications: Current Outpatient Prescriptions  Medication Sig Dispense Refill  . ALPRAZolam (XANAX) 1 MG tablet Take 1 mg twice daily by mouth for 2 weeks and then decrease to 1 mg daily for 2 weeks. 45 tablet 0  . fenofibrate 160 MG tablet     . gabapentin (NEURONTIN) 300 MG capsule     . levothyroxine (SYNTHROID, LEVOTHROID) 88 MCG tablet     . metoprolol succinate (TOPROL-XL) 50 MG 24 hr tablet     . oxyCODONE (ROXICODONE) 15 MG immediate release tablet     . QUEtiapine (SEROQUEL) 200 MG tablet Take 1 tablet (200 mg total) by mouth at bedtime. 30 tablet 4   No current facility-administered medications for this visit.    Medical Decision Making:  Established Problem, Stable/Improving (1), New Problem, with no additional work-up planned (3), Review of Medication Regimen & Side Effects (2) and Review of New Medication or Change in Dosage (2)  Treatment Plan Summary:Medication management and Plan   Major depressive disorder, recurrent severe without psychotic features Discontinue the Prozac. Start Zoloft at 25mg  po  qd. Continue Seroquel at 200 mg at bedtime. Start Cogentin at 0.5 mg once daily since patient has some cogwheeling rigidity.  Anxiety Patient urged to taper off the Xanax and he states that he will try to reduce to 1 mg twice daily.  Discussed that patient would benefit from seeing a therapist but he states that he has seen a therapist in the past and does not want to do that at this time. Return to clinic in 4 weeks time or call before if necessary   Daryan Buell 08/15/2015, 2:45 PM

## 2015-08-20 IMAGING — CT CT ABD-PELV W/O CM
2 of 4 series · 16 of 46 positions shown, 18 images · non-contrast
Comparison: October 14, 2010

CLINICAL DATA: Hematuria. Patient had prior right nephrectomy in
0000.

EXAM:
CT ABDOMEN AND PELVIS WITHOUT CONTRAST
TECHNIQUE: Multidetector CT imaging of the abdomen and pelvis was performed
following the standard protocol without IV contrast.

[Series 2: stone · axial · 0.86mm/px · z∈[-1250,-814]mm · 13 of 97 slices shown, 15 images]
[im 5/97  soft-tissue]
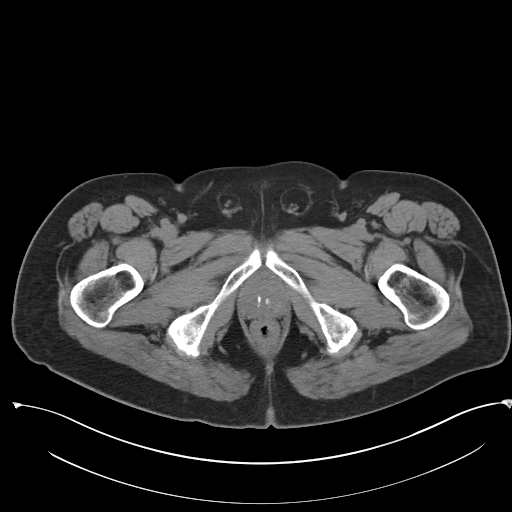
[im 5/97  bone]
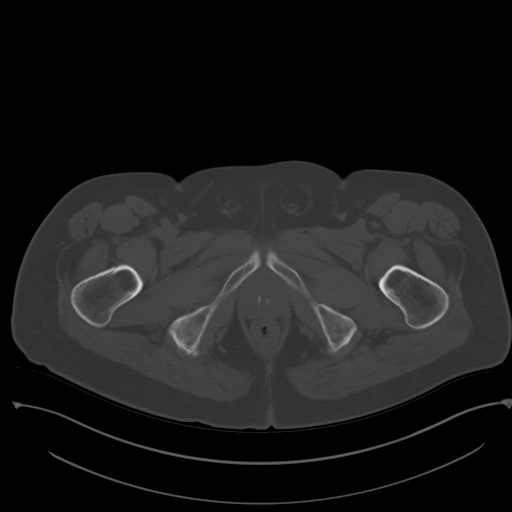
[im 13/97  soft-tissue]
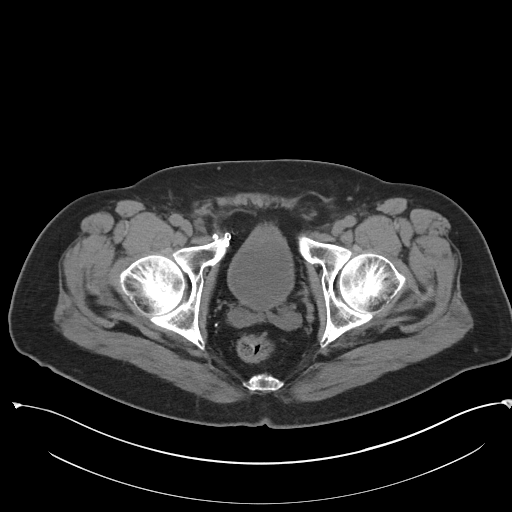
[im 21/97  soft-tissue]
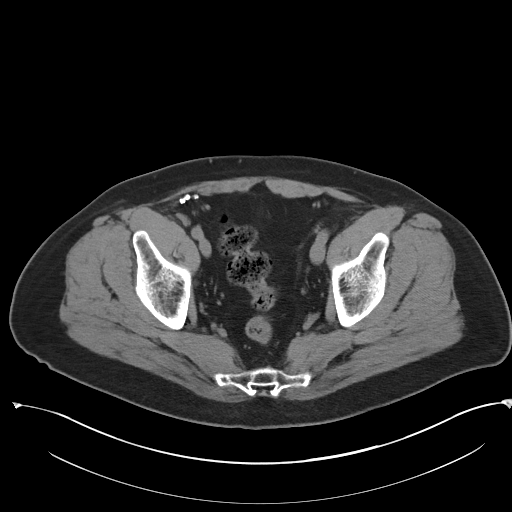
[im 26/97  soft-tissue]
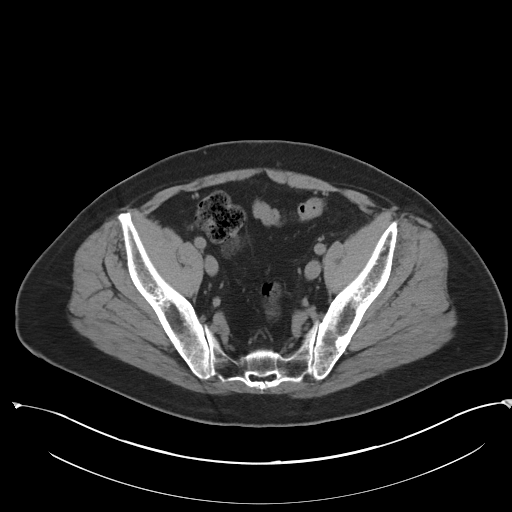
[im 34/97  soft-tissue]
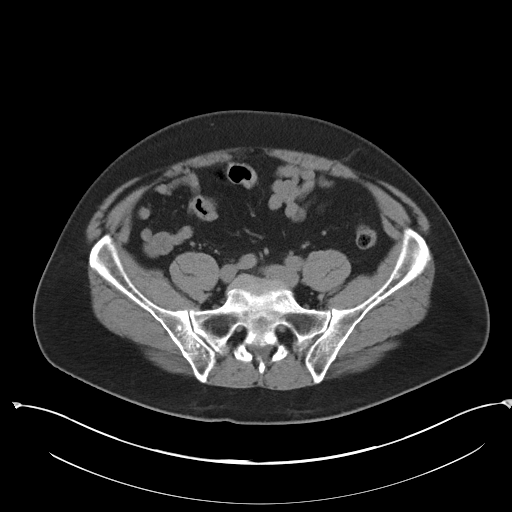
[im 42/97  soft-tissue]
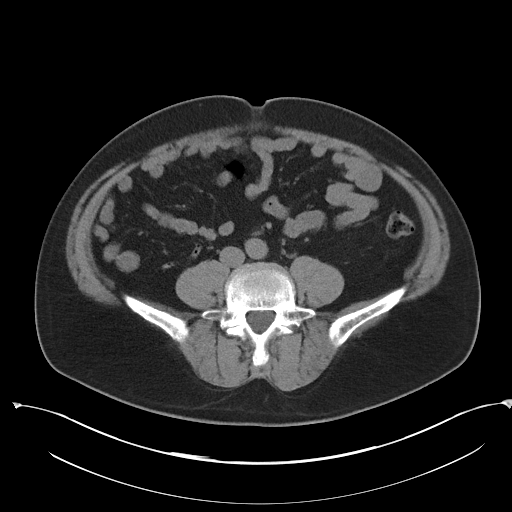
[im 51/97  soft-tissue]
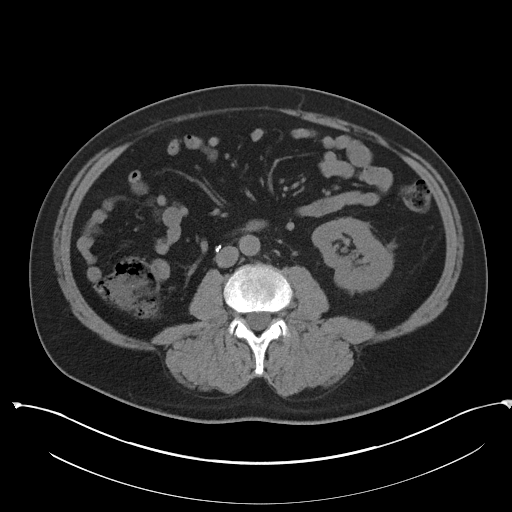
[im 55/97  soft-tissue]
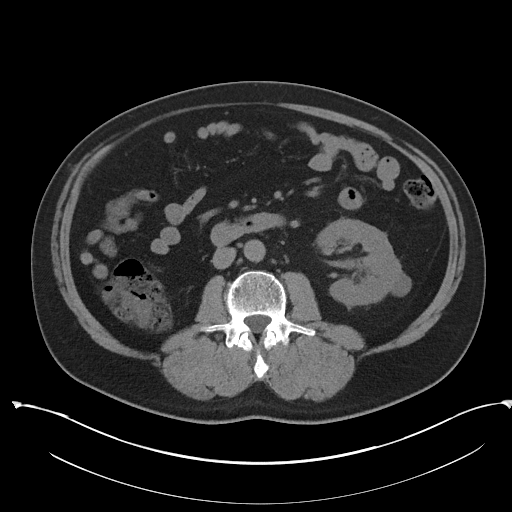
[im 63/97  soft-tissue]
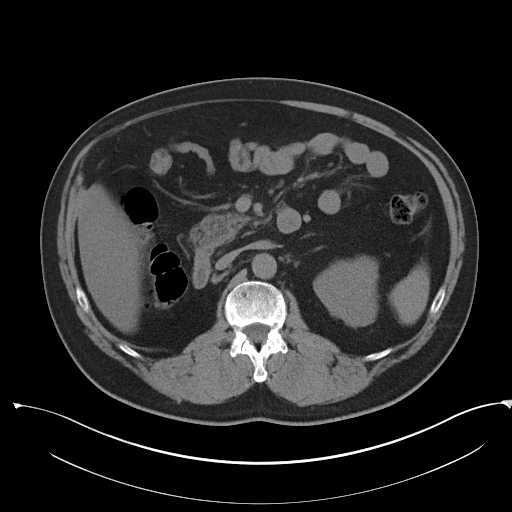
[im 63/97  bone]
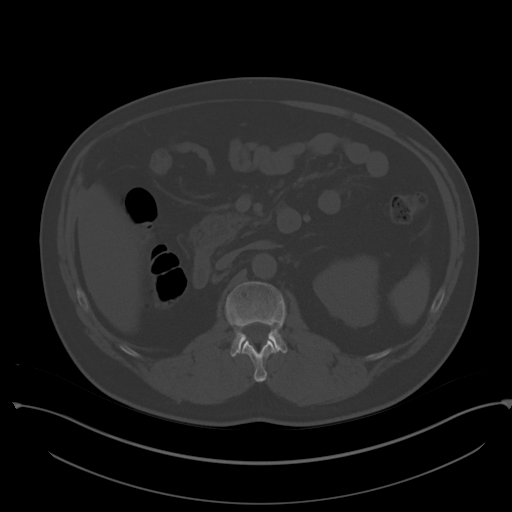
[im 71/97  soft-tissue]
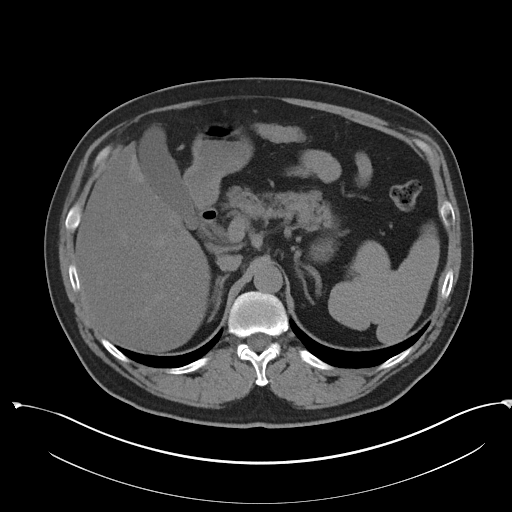
[im 76/97  soft-tissue]
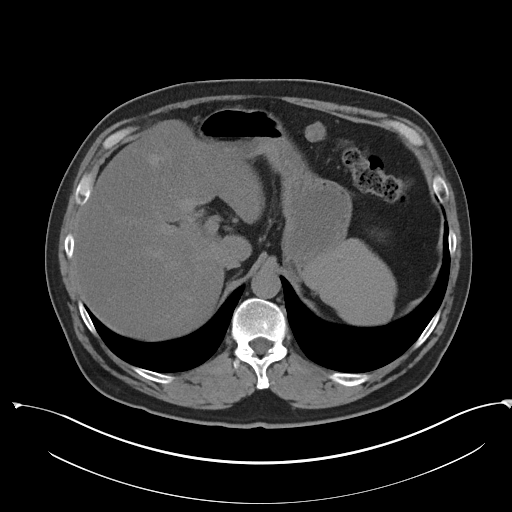
[im 84/97  soft-tissue]
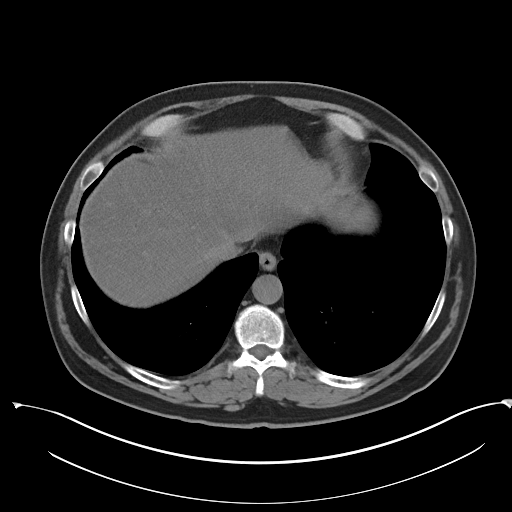
[im 92/97  soft-tissue]
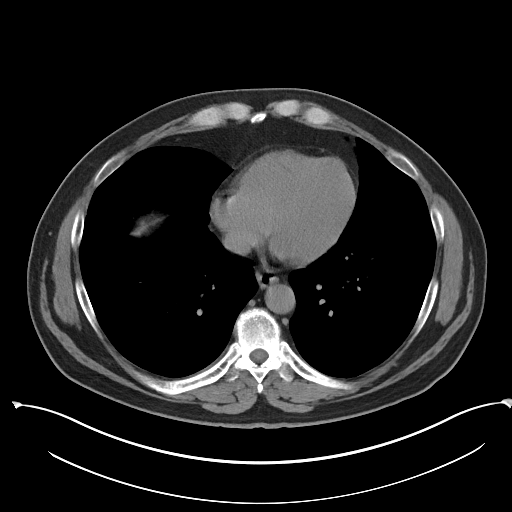

[Series 5: cor · coronal · 0.85mm/px · 3 of 143 slices shown]
[im 48/143  soft-tissue]
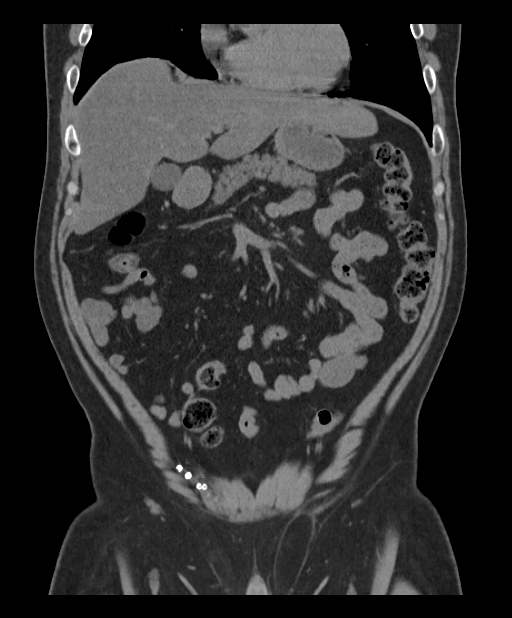
[im 64/143  soft-tissue]
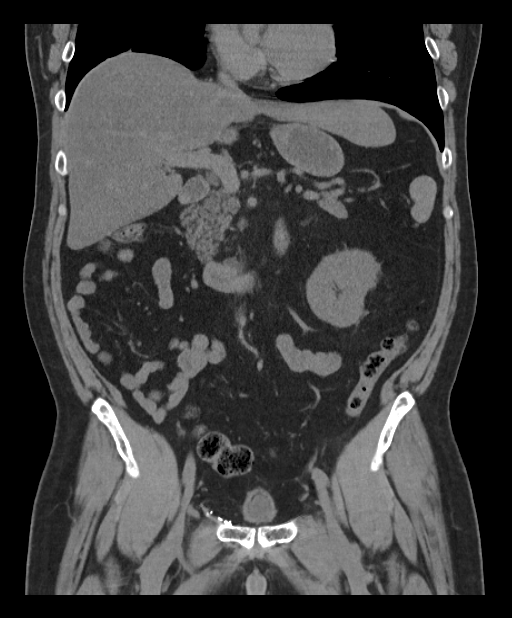
[im 79/143  soft-tissue]
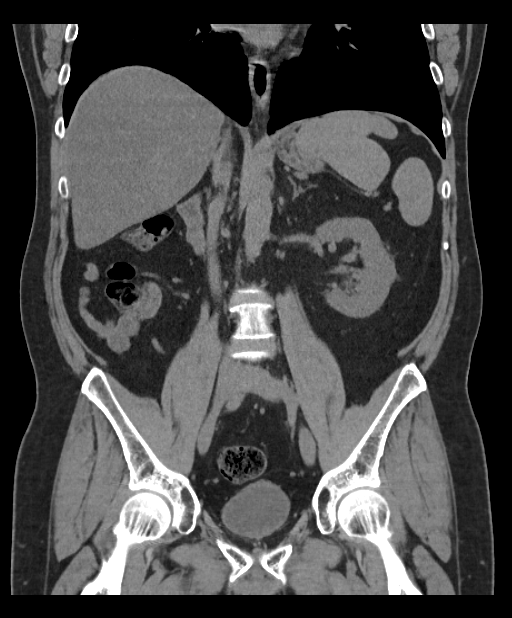

[16 of 46 positions shown; findings below may reference images not displayed]

FINDINGS: Patient is status post prior right nephrectomy. No abnormal mass is
identified in the right renal fossa. Surgical clips are identified
in the right retroperitoneum. There is a 1.4 x 1.9 cm low density
lesion in the lateral midpole left kidney. Evaluation is limited
without contrast. There is minimal chronic stranding surrounding the
left kidney. There is a 4.3 mm stone in the distal left ureter
proximal to the ureterovesicular junction without causing
significant hydronephrosis.

There is diffuse fatty infiltration of liver. The spleen, pancreas,
gallbladder, adrenal glands are normal. The aorta is normal. There
is no abdominal lymphadenopathy. There is no small bowel obstruction
or diverticulitis. The appendix is normal.

Images of the pelvis demonstrate partial fluid-filled bladder
without abnormality. There is left inguinal herniation of mesenteric
fat. The visualized lung bases are clear. Minimal degenerative joint
changes of the lower thoracic spine are identified. There probable
small bone islands in the bilateral femur, right ilium.
IMPRESSION: 4.3 mm stone in the distal left ureter proximal to the
ureterovesicular junction without causing significant left
hydronephrosis.

A 1.4 x 1.9 cm low density lesion in the lateral midpole left
kidney. Evaluation is limited without contrast. Given the patient's
history of prior renal carcinoma, further evaluation with ultrasound
is recommended for further evaluation.

Status post right nephrectomy with no abnormal soft tissues in the
right renal fossa to suggest recurrence.

## 2015-09-06 ENCOUNTER — Ambulatory Visit: Payer: Medicare Other | Admitting: Psychiatry

## 2015-09-10 ENCOUNTER — Telehealth: Payer: Self-pay | Admitting: Psychiatry

## 2015-09-10 NOTE — Telephone Encounter (Signed)
pt called again can not sleep. states that the medication does not work and that seroquel or xanax is the only thing that works. pt was told that [name] was out of the office this whole week.  and that I would send Dr Einar Grad and [name] a message.

## 2015-09-10 NOTE — Telephone Encounter (Signed)
pt stated that he as not started the zolft.  pt was told that he needed to at least try the zoloft.  that more then likely the dr will not give xanax.

## 2015-09-11 NOTE — Telephone Encounter (Signed)
[name] patient states that he is not sleeping he needs something to help him sleep.

## 2015-09-12 ENCOUNTER — Encounter: Payer: Self-pay | Admitting: Emergency Medicine

## 2015-09-12 ENCOUNTER — Other Ambulatory Visit: Payer: Self-pay | Admitting: Psychiatry

## 2015-09-12 ENCOUNTER — Other Ambulatory Visit: Payer: Self-pay

## 2015-09-12 ENCOUNTER — Emergency Department: Payer: Medicare Other

## 2015-09-12 ENCOUNTER — Emergency Department
Admission: EM | Admit: 2015-09-12 | Discharge: 2015-09-13 | Disposition: A | Discharge status: Home / Self Care | Payer: Medicare Other | Attending: Emergency Medicine | Admitting: Emergency Medicine

## 2015-09-12 ENCOUNTER — Telehealth: Payer: Self-pay

## 2015-09-12 DIAGNOSIS — Z85528 Personal history of other malignant neoplasm of kidney: Secondary | ICD-10-CM | POA: Insufficient documentation

## 2015-09-12 DIAGNOSIS — E039 Hypothyroidism, unspecified: Secondary | ICD-10-CM | POA: Insufficient documentation

## 2015-09-12 DIAGNOSIS — F17210 Nicotine dependence, cigarettes, uncomplicated: Secondary | ICD-10-CM | POA: Insufficient documentation

## 2015-09-12 DIAGNOSIS — Z79899 Other long term (current) drug therapy: Secondary | ICD-10-CM | POA: Diagnosis not present

## 2015-09-12 DIAGNOSIS — F419 Anxiety disorder, unspecified: Secondary | ICD-10-CM | POA: Diagnosis not present

## 2015-09-12 DIAGNOSIS — R002 Palpitations: Secondary | ICD-10-CM | POA: Insufficient documentation

## 2015-09-12 DIAGNOSIS — R079 Chest pain, unspecified: Secondary | ICD-10-CM

## 2015-09-12 LAB — CBC
HEMATOCRIT: 47 % (ref 40.0–52.0)
HEMOGLOBIN: 16 g/dL (ref 13.0–18.0)
MCH: 29.6 pg (ref 26.0–34.0)
MCHC: 34.1 g/dL (ref 32.0–36.0)
MCV: 86.8 fL (ref 80.0–100.0)
Platelets: 211 10*3/uL (ref 150–440)
RBC: 5.42 MIL/uL (ref 4.40–5.90)
RDW: 14 % (ref 11.5–14.5)
WBC: 8.9 10*3/uL (ref 3.8–10.6)

## 2015-09-12 LAB — COMPREHENSIVE METABOLIC PANEL
ALBUMIN: 4.5 g/dL (ref 3.5–5.0)
ALT: 30 U/L (ref 17–63)
AST: 25 U/L (ref 15–41)
Alkaline Phosphatase: 63 U/L (ref 38–126)
Anion gap: 7 (ref 5–15)
BILIRUBIN TOTAL: 0.7 mg/dL (ref 0.3–1.2)
BUN: 21 mg/dL — AB (ref 6–20)
CHLORIDE: 105 mmol/L (ref 101–111)
CO2: 29 mmol/L (ref 22–32)
CREATININE: 1.6 mg/dL — AB (ref 0.61–1.24)
Calcium: 9.5 mg/dL (ref 8.9–10.3)
GFR calc Af Amer: 52 mL/min — ABNORMAL LOW (ref >60)
GFR calc non Af Amer: 45 mL/min — ABNORMAL LOW (ref >60)
GLUCOSE: 116 mg/dL — AB (ref 65–99)
POTASSIUM: 4.6 mmol/L (ref 3.5–5.1)
Sodium: 141 mmol/L (ref 135–145)
Total Protein: 7.2 g/dL (ref 6.5–8.1)

## 2015-09-12 LAB — TROPONIN I: Troponin I: <0.03 ng/mL (ref <0.031)

## 2015-09-12 MED ORDER — TRAZODONE HCL 100 MG PO TABS: 100.0000 mg | ORAL_TABLET | Freq: Every evening | ORAL | Status: DC | PRN

## 2015-09-12 NOTE — ED Notes (Signed)
Patient states he has been taking 2mg  xanax TID for the last 4 years. Has been weaning off at MD's request. States today is his first day without any xanax and he is having a hard time. Very shakey and emotional. States he also took his first dose of Zoloft and says it made him feel "funny". States he does not want to take anymore of that.

## 2015-09-12 NOTE — Telephone Encounter (Signed)
callled pt gave him the message (per [name])  Pt has prescriptions of Xanax and seroquel per his chart. He is currently taking Xanax 1mg  BID and Seroquel 200mg  po qhs.  I will add Trazodone 100mg  po qhs for insomnia.  Follow up with Dr Einar Grad.   Pt states that he can't take the zoloft (which he started last night he stated that it made him dizzy) and that he can not take the trazodone he has taken it before and it did nothing.  Pt was then advised that he needed to go to the er or contact his pcp to see if they can give him something.  Pt wanted nurse to call his pcp's office for him.- stated that they might think he was pill seeking if he called.  I told him that I couldn't call but i would send [name] another message and ask her if she can call patient back.

## 2015-09-12 NOTE — ED Notes (Signed)
Pt arrived to the ED for complaints of anxiety and chest pain. Pt states that he thinks that he is experiencing an anxiety attack and that is causing him to have chest pain. Pt is not very communicative. Pt is AOx4 very anxious.

## 2015-09-12 NOTE — Telephone Encounter (Signed)
Pt has prescriptions of Xanax and seroquel per his chart. He is currently taking Xanax 1mg  BID and Seroquel 200mg  po qhs.  I will add Trazodone 100mg  po qhs for insomnia.  Follow up with Dr Einar Grad.

## 2015-09-13 ENCOUNTER — Encounter: Payer: Self-pay | Admitting: Psychiatry

## 2015-09-13 ENCOUNTER — Ambulatory Visit (INDEPENDENT_AMBULATORY_CARE_PROVIDER_SITE_OTHER): Payer: 59 | Admitting: Psychiatry

## 2015-09-13 VITALS — BP 124/86 | HR 82 | Temp 98.0°F | Wt 222.4 lb

## 2015-09-13 DIAGNOSIS — F13230 Sedative, hypnotic or anxiolytic dependence with withdrawal, uncomplicated: Secondary | ICD-10-CM | POA: Diagnosis not present

## 2015-09-13 DIAGNOSIS — F332 Major depressive disorder, recurrent severe without psychotic features: Secondary | ICD-10-CM | POA: Diagnosis not present

## 2015-09-13 DIAGNOSIS — F13930 Sedative, hypnotic or anxiolytic use, unspecified with withdrawal, uncomplicated: Secondary | ICD-10-CM

## 2015-09-13 LAB — TROPONIN I

## 2015-09-13 MED ORDER — ALPRAZOLAM 0.5 MG PO TABS
1.0000 mg | ORAL_TABLET | Freq: Once | ORAL | Status: AC
  Administered 2015-09-13: 1 mg via ORAL
  Filled 2015-09-13: qty 2

## 2015-09-13 MED ORDER — ALPRAZOLAM 1 MG PO TABS: 1.0000 mg | ORAL_TABLET | Freq: Two times a day (BID) | ORAL | Status: DC

## 2015-09-13 MED ORDER — TRAZODONE HCL 50 MG PO TABS: 50.0000 mg | ORAL_TABLET | Freq: Every day | ORAL | Status: DC

## 2015-09-13 NOTE — Telephone Encounter (Signed)
left message for patient to set up a appt for today

## 2015-09-13 NOTE — Telephone Encounter (Signed)
pt was seen today.

## 2015-09-13 NOTE — ED Provider Notes (Signed)
[name] Emergency Department Provider Note   ____________________________________________  Time seen: Approximately 2319 AM  I have reviewed the triage vital signs and the nursing notes.   HISTORY  Chief Complaint Anxiety and Chest Pain    HPI Zachary Reynolds is a 60 y.o. male who comes into the hospital today with some chest pain and anxiety. The patient reports that he was taken off of Xanax after being on it for 4 years. He reports that he was taking 6 mg in January and then was weaned to 4 eventually 2 and then he was weaned off and today he has not taken any Xanax. He reports that he has anxiety and he's having pings of pain in his chest. He also feels as though he is having palpitations. He reports that he does not have support of his Xanax and he feels as though he cannot sleep if he's having a very hard time. The patient reports that he takes Seroquel at night and typically Xanax to sleep is only able to sleep for 3 hours due to the lack of Xanax. He reports that he was given Zoloft but his first dose made him dizzy. He reports that the nurse called him and some trazodone today but he reports that he did not like the side effects he had from them in the past so he did not want to take it. He reports that he's tried Vistaril, Benadryl, Lunesta and Ambien to sleep and he does not helped in the past. He reports that he thinks he needs the support of the benzodiazepine. The patient is feeling very anxious at this time. The patient is here for evaluation.The patient's pain does not radiate, he does not have any sweats, shortness of breath, nausea or vomiting. He is feeling very shaky.   Past Medical History  Diagnosis Date  . Renal disorder   . Slow urinary stream   . Hepatitis   . Constipation   . Cancer of right kidney ([place])   . Bipolar depression ([place])   . Chronic back pain   . ED (erectile dysfunction)   . Hematuria     gross  . Hypothyroidism   .  Nephrolithiasis   . Elevated serum creatinine   . Testicular/scrotal pain   . Malaise   . BPH with obstruction/lower urinary tract symptoms   . Anxiety   . Arrhythmia   . Weak urinary stream     Patient Active Problem List   Diagnosis Date Noted  . Difficulty sleeping 05/01/2014  . 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 Has a tremor 02/22/2014    Past Surgical History  Procedure Laterality Date  . Tonsillectomy    . Hernia repair Bilateral   . Nephrectomy Right     Current Outpatient Rx  Name  Route  Sig  Dispense  Refill  . benztropine (COGENTIN) 0.5 MG tablet   Oral   Take 1 tablet (0.5 mg total) by mouth daily.   30 tablet   1   . fenofibrate 160 MG tablet   Oral   Take 160 mg by mouth daily.          Marland Kitchen gabapentin (NEURONTIN) 300 MG capsule   Oral   Take 300 mg by mouth 3 (three) times daily.          Marland Kitchen levothyroxine (SYNTHROID, LEVOTHROID) 88 MCG tablet   Oral   Take 88 mcg by mouth daily before breakfast.          .  metoprolol succinate (TOPROL-XL) 50 MG 24 hr tablet   Oral   Take 50 mg by mouth daily.          Marland Kitchen oxyCODONE (ROXICODONE) 15 MG immediate release tablet   Oral   Take 15 mg by mouth every 6 (six) hours as needed.          Marland Kitchen QUEtiapine (SEROQUEL) 200 MG tablet   Oral   Take 1 tablet (200 mg total) by mouth at bedtime.   30 tablet   4   . sertraline (ZOLOFT) 25 MG tablet   Oral   Take 1 tablet (25 mg total) by mouth daily.   30 tablet   2   . traZODone (DESYREL) 100 MG tablet   Oral   Take 1 tablet (100 mg total) by mouth at bedtime as needed for sleep.   30 tablet   0   . ALPRAZolam (XANAX) 1 MG tablet      Take 1 mg twice daily by mouth for 2 weeks and then decrease to 1 mg daily for 2 weeks. Patient not taking: Reported on 09/12/2015   45 tablet   0     Allergies Amlodipine; Haloperidol; Nucynta; and Prednisone  Family History  Problem Relation Age of Onset  . Prostate cancer Brother   . Thyroid disease Brother    . Stroke Mother   . Hypertension Mother   . Thyroid disease Mother   . Macular degeneration Father   . Thyroid disease Sister   . Thyroid disease Sister     Social History Social History  Substance Use Topics  . Smoking status: Current Every Day Smoker    Types: Cigarettes    Start date: 12/26/1974  . Smokeless tobacco: Never Used  . Alcohol Use: No    Review of Systems Constitutional: No fever/chills Eyes: No visual changes. ENT: No sore throat. Cardiovascular:  chest pain. Respiratory: Denies shortness of breath. Gastrointestinal: No abdominal pain.  No nausea, no vomiting.  No diarrhea.  No constipation. Genitourinary: Negative for dysuria. Musculoskeletal: Negative for back pain. Skin: Negative for rash. Neurological: Negative for headaches, focal weakness or numbness. Psych: Anxiety  10-point ROS otherwise negative.  ____________________________________________   PHYSICAL EXAM:  VITAL SIGNS: ED Triage Vitals  Enc Vitals Group     BP 09/12/15 2018 159/89 mmHg     Pulse Rate 09/12/15 2018 93     Resp 09/12/15 2018 20     Temp 09/12/15 2018 98.9 F (37.2 C)     Temp Source 09/12/15 2018 Oral     SpO2 09/12/15 2018 97 %     Weight 09/12/15 2018 232 lb (105.235 kg)     Height 09/12/15 2018 6\' 2"  (1.88 m)     Head Cir --      Peak Flow --      Pain Score 09/12/15 2020 8     Pain Loc --      Pain Edu? --      Excl. in [place]? --     Constitutional: Alert and oriented. Anxious appearing and in mild distress. Eyes: Conjunctivae are normal. PERRL. EOMI. Head: Atraumatic. Nose: No congestion/rhinnorhea. Mouth/Throat: Mucous membranes are moist.  Oropharynx non-erythematous. Cardiovascular: Normal rate, regular rhythm. Grossly normal heart sounds.  Good peripheral circulation. Respiratory: Normal respiratory effort.  No retractions. Lungs CTAB. Gastrointestinal: Soft and nontender. No distention. Positive bowel sounds Musculoskeletal: No lower extremity  tenderness nor edema.   Neurologic:  Normal speech and language.  Skin:  Skin  is warm, dry and intact.  Psychiatric: Anxious Mood and affect . Speech and behavior are normal.  ____________________________________________   LABS (all labs ordered are listed, but only abnormal results are displayed)  Labs Reviewed  COMPREHENSIVE METABOLIC PANEL - Abnormal; Notable for the following:    Glucose, Bld 116 (*)    BUN 21 (*)    Creatinine, Ser 1.60 (*)    GFR calc non Af Amer 45 (*)    GFR calc Af Amer 52 (*)    All other components within normal limits  CBC  TROPONIN I  TROPONIN I   ____________________________________________  EKG  ED ECG REPORT I, Loney Hering, the attending physician, personally viewed and interpreted this ECG.   Date: 09/12/2015  EKG Time: 2021  Rate: 90  Rhythm: normal sinus rhythm  Axis: Normal  Intervals:none  ST&T Change: None  ____________________________________________  RADIOLOGY  Chest x-ray: No acute cardiopulmonary process seen ____________________________________________   PROCEDURES  Procedure(s) performed: None  Critical Care performed: No  ____________________________________________   INITIAL IMPRESSION / ASSESSMENT AND PLAN / ED COURSE  Pertinent labs & imaging results that were available during my care of the patient were reviewed by me and considered in my medical decision making (see chart for details).  This is a 60 year old male who comes into the hospital today with anxiety and some chest pain. The patient reports that he has been weaned off his Xanax and did not take any Xanax today. He is feeling very anxious and he is a little shaky. The patient reports that he doesn't feel supported by his physician and he does not think that the other medications are going to help him. The patient is asking if I could write him a prescription for Xanax tonight. I did inform the patient that I can give him a dose of his Xanax  to help him sleep for the night but he needs to follow back up with his doctor and talk about appropriate ways to wean from his Xanax to a better anxiety medication. I informed him that I cannot manage his medications every and not a primary care physician. The patient did receive 2 sets of troponins and they are negative. The patient is not having any constant pain he does not have any radiation of his pain. The patient did receive a dose of Xanax 1 mg orally here. The patient be discharged home to follow back up with his primary care physician. He has no further concerns. I did again tell him that I am unable to give him a prescription for his medication and that is something he will need to discuss with his primary care physician. ____________________________________________   FINAL CLINICAL IMPRESSION(S) / ED DIAGNOSES  Final diagnoses:  Anxiety  Chest pain, unspecified chest pain type  Palpitations      NEW MEDICATIONS STARTED DURING THIS VISIT:  New Prescriptions   No medications on file     Note:  This document was prepared using Dragon voice recognition software and may include unintentional dictation errors.    Loney Hering, MD 09/13/15 [id number]

## 2015-09-13 NOTE — Progress Notes (Signed)
Patient ID: Zachary Reynolds, male   DOB: 15-Jan-1956, 60 y.o.   MRN: [id number]  [facility] MD/PA/NP OP Progress Note  09/13/2015 1:50 PM Zachary Reynolds  MRN:  [id number]  Subjective:  Patient is a 60 year old male with history of benzodiazepine dependence presented due to Xanax withdrawal. He was seen last night at the emergency room and was given 1 dose of Xanax at that time. He has been following [name] who has been trying to taper him out of the Xanax prescribed by Dr. Jimmye Norman. Pt was given a prescription of Xanax 1 mg #45 and he took more than the moderate office prescribed medication and ran out of them. He was noted to be shaking during the interview. He reported that he took the medication which was given to him last night at the ER. He reported that he was previously taking 6 mg in the past and he is trying to decrease the dose of the Xanax at this time. He stated that he does not want to continue taking the Seroquel as he is having all spots in his head. He is showing me the same. He reported that he is also not taking trazodone on a regular basis. Later he asked me for a higher dose of the trazodone. He stays at home and has been abusing sleeping aids.  He appeared tremulous and focus on getting high doses of benzodiazepines at this time. He currently denied having any suicidal homicidal ideations or plans.    Chief Complaint:  Chief Complaint    Follow-up; Insomnia; Anxiety; shaking     Visit Diagnosis:     ICD-9-CM ICD-10-CM   1. Major depressive disorder, recurrent, severe without psychotic features ([place]) 296.33 F33.2     Past Medical History:  Past Medical History  Diagnosis Date  . Renal disorder   . Slow urinary stream   . Hepatitis   . Constipation   . Cancer of right kidney ([place])   . Bipolar depression ([place])   . Chronic back pain   . ED (erectile dysfunction)   . Hematuria     gross  . Hypothyroidism   . Nephrolithiasis   . Elevated serum creatinine   . Testicular/scrotal  pain   . Malaise   . BPH with obstruction/lower urinary tract symptoms   . Anxiety   . Arrhythmia   . Weak urinary stream     Past Surgical History  Procedure Laterality Date  . Tonsillectomy    . Hernia repair Bilateral   . Nephrectomy Right    Family History:  Family History  Problem Relation Age of Onset  . Prostate cancer Brother   . Thyroid disease Brother   . Stroke Mother   . Hypertension Mother   . Thyroid disease Mother   . Macular degeneration Father   . Thyroid disease Sister   . Thyroid disease Sister    Social History:  Social History   Social History  . Marital Status: Single    Spouse Name: N/A  . Number of Children: N/A  . Years of Education: N/A   Social History Main Topics  . Smoking status: Current Every Day Smoker    Types: Cigarettes    Start date: 12/26/1974  . Smokeless tobacco: Never Used  . Alcohol Use: No  . Drug Use: No  . Sexual Activity: Not Currently   Other Topics Concern  . None   Social History Narrative   Additional History:   Assessment:   Musculoskeletal: Strength &  Muscle Tone: within normal limits Gait & Station: normal Patient leans: N/A  Psychiatric Specialty Exam: Insomnia PMH includes: depression (as has largely remained at baseline and not worsened).  Anxiety Symptoms include insomnia and nervous/anxious behavior.    Depression        Associated symptoms include insomnia.  Past medical history includes anxiety.     Review of Systems  Constitutional: Negative.   Eyes: Negative.   Respiratory: Negative.   Cardiovascular: Negative.   Gastrointestinal: Negative.   Genitourinary: Negative.   Musculoskeletal: Positive for back pain and neck pain.  Skin: Negative.   Neurological: Positive for tremors.  Endo/Heme/Allergies: Negative.   Psychiatric/Behavioral: Positive for depression (as has largely remained at baseline and not worsened) and substance abuse. The patient is nervous/anxious and has insomnia.    All other systems reviewed and are negative.   Blood pressure 124/86, pulse 82, temperature 98 F (36.7 C), temperature source Tympanic, weight 222 lb 6.4 oz (100.88 kg), SpO2 96 %.Body mass index is 28.54 kg/(m^2).  General Appearance: Well Groomed  Eye Contact:  Good  Speech:  Slightly slow rate  Volume:  Normal  Mood:  Anxious and Dysphoric  Affect:  Constricted  Thought Process:  Logical  Orientation:  Full (Time, Place, and Person)  Thought Content:  Negative  Suicidal Thoughts:  No  Homicidal Thoughts:  No  Memory:  Immediate;   Good Recent;   Good Remote;   Good  Judgement:  Good  Insight:  Good  Psychomotor Activity:  Negative  Concentration:  Good  Recall:  Good  Fund of Knowledge: Good  Language: Good  Akathisia:  Negative  Handed:    AIMS (if indicated):  Cogwheeling rigidity present in both arms   Assets:  Communication Skills Desire for Improvement  ADL's:  Intact  Cognition: WNL  Sleep:  Okay with trazodone   Is the patient at risk to self?  No. Has the patient been a risk to self in the past 6 months?  No. Has the patient been a risk to self within the distant past?  No. Is the patient a risk to others?  No. Has the patient been a risk to others in the past 6 months?  No. Has the patient been a risk to others within the distant past?  No.  Current Medications: Current Outpatient Prescriptions  Medication Sig Dispense Refill  . ALPRAZolam (XANAX) 1 MG tablet Take 1 mg twice daily by mouth for 2 weeks and then decrease to 1 mg daily for 2 weeks. 45 tablet 0  . benztropine (COGENTIN) 0.5 MG tablet Take 1 tablet (0.5 mg total) by mouth daily. 30 tablet 1  . fenofibrate 160 MG tablet Take 160 mg by mouth daily.     Marland Kitchen gabapentin (NEURONTIN) 300 MG capsule Take 300 mg by mouth 3 (three) times daily.     Marland Kitchen levothyroxine (SYNTHROID, LEVOTHROID) 88 MCG tablet Take 88 mcg by mouth daily before breakfast.     . metoprolol succinate (TOPROL-XL) 50 MG 24 hr tablet  Take 50 mg by mouth daily.     Marland Kitchen oxyCODONE (ROXICODONE) 15 MG immediate release tablet Take 15 mg by mouth every 6 (six) hours as needed.     Marland Kitchen QUEtiapine (SEROQUEL) 200 MG tablet Take 1 tablet (200 mg total) by mouth at bedtime. 30 tablet 4  . traZODone (DESYREL) 100 MG tablet Take 1 tablet (100 mg total) by mouth at bedtime as needed for sleep. 30 tablet 0  . sertraline (ZOLOFT)  25 MG tablet Take 1 tablet (25 mg total) by mouth daily. (Patient not taking: Reported on 09/13/2015) 30 tablet 2   No current facility-administered medications for this visit.    Medical Decision Making:  Established Problem, Stable/Improving (1), New Problem, with no additional work-up planned (3), Review of Medication Regimen & Side Effects (2) and Review of New Medication or Change in Dosage (2)  Treatment Plan Summary:Medication management and Plan   Discussed with patient about his medications I will give him a prescription of Xanax 1 mg by mouth twice a day 14 day supply with 1 refill He also wants a higher dose of trazodone so I will prescribe him trazodone 50 mg as he already has a refill on the current prescription of trazodone 100 mg. Discontinue Seroquel He will follow up with [name] as scheduled    More than 50% of the time spent in psychoeducation, counseling and coordination of care.    This note was generated in part or whole with voice recognition software. Voice regonition is usually quite accurate but there are transcription errors that can and very often do occur. I apologize for any typographical errors that were not detected and corrected.   Rainey Pines, MD  09/13/2015, 1:50 PM

## 2015-09-13 NOTE — Discharge Instructions (Signed)
Nonspecific Chest Pain  Chest pain can be caused by many different conditions. There is always a chance that your pain could be related to something serious, such as a heart attack or a blood clot in your lungs. Chest pain can also be caused by conditions that are not life-threatening. If you have chest pain, it is very important to follow up with your health care provider. CAUSES  Chest pain can be caused by:  Heartburn.  Pneumonia or bronchitis.  Anxiety or stress.  Inflammation around your heart (pericarditis) or lung (pleuritis or pleurisy).  A blood clot in your lung.  A collapsed lung (pneumothorax). It can develop suddenly on its own (spontaneous pneumothorax) or from trauma to the chest.  Shingles infection (varicella-zoster virus).  Heart attack.  Damage to the bones, muscles, and cartilage that make up your chest wall. This can include:  Bruised bones due to injury.  Strained muscles or cartilage due to frequent or repeated coughing or overwork.  Fracture to one or more ribs.  Sore cartilage due to inflammation (costochondritis). RISK FACTORS  Risk factors for chest pain may include:  Activities that increase your risk for trauma or injury to your chest.  Respiratory infections or conditions that cause frequent coughing.  Medical conditions or overeating that can cause heartburn.  Heart disease or family history of heart disease.  Conditions or health behaviors that increase your risk of developing a blood clot.  Having had chicken pox (varicella zoster). SIGNS AND SYMPTOMS Chest pain can feel like:  Burning or tingling on the surface of your chest or deep in your chest.  Crushing, pressure, aching, or squeezing pain.  Dull or sharp pain that is worse when you move, cough, or take a deep breath.  Pain that is also felt in your back, neck, shoulder, or arm, or pain that spreads to any of these areas. Your chest pain may come and go, or it may stay  constant. DIAGNOSIS Lab tests or other studies may be needed to find the cause of your pain. Your health care provider may have you take a test called an ambulatory ECG (electrocardiogram). An ECG records your heartbeat patterns at the time the test is performed. You may also have other tests, such as:  Transthoracic echocardiogram (TTE). During echocardiography, sound waves are used to create a picture of all of the heart structures and to look at how blood flows through your heart.  Transesophageal echocardiogram (TEE).This is a more advanced imaging test that obtains images from inside your body. It allows your health care provider to see your heart in finer detail.  Cardiac monitoring. This allows your health care provider to monitor your heart rate and rhythm in real time.  Holter monitor. This is a portable device that records your heartbeat and can help to diagnose abnormal heartbeats. It allows your health care provider to track your heart activity for several days, if needed.  Stress tests. These can be done through exercise or by taking medicine that makes your heart beat more quickly.  Blood tests.  Imaging tests. TREATMENT  Your treatment depends on what is causing your chest pain. Treatment may include:  Medicines. These may include:  Acid blockers for heartburn.  Anti-inflammatory medicine.  Pain medicine for inflammatory conditions.  Antibiotic medicine, if an infection is present.  Medicines to dissolve blood clots.  Medicines to treat coronary artery disease.  Supportive care for conditions that do not require medicines. This may include:  Resting.  Applying heat  or cold packs to injured areas.  Limiting activities until pain decreases. HOME CARE INSTRUCTIONS  If you were prescribed an antibiotic medicine, finish it all even if you start to feel better.  Avoid any activities that bring on chest pain.  Do not use any tobacco products, including  cigarettes, chewing tobacco, or electronic cigarettes. If you need help quitting, ask your health care provider.  Do not drink alcohol.  Take medicines only as directed by your health care provider.  Keep all follow-up visits as directed by your health care provider. This is important. This includes any further testing if your chest pain does not go away.  If heartburn is the cause for your chest pain, you may be told to keep your head raised (elevated) while sleeping. This reduces the chance that acid will go from your stomach into your esophagus.  Make lifestyle changes as directed by your health care provider. These may include:  Getting regular exercise. Ask your health care provider to suggest some activities that are safe for you.  Eating a heart-healthy diet. A registered dietitian can help you to learn healthy eating options.  Maintaining a healthy weight.  Managing diabetes, if necessary.  Reducing stress. SEEK MEDICAL CARE IF:  Your chest pain does not go away after treatment.  You have a rash with blisters on your chest.  You have a fever. SEEK IMMEDIATE MEDICAL CARE IF:   Your chest pain is worse.  You have an increasing cough, or you cough up blood.  You have severe abdominal pain.  You have severe weakness.  You faint.  You have chills.  You have sudden, unexplained chest discomfort.  You have sudden, unexplained discomfort in your arms, back, neck, or jaw.  You have shortness of breath at any time.  You suddenly start to sweat, or your skin gets clammy.  You feel nauseous or you vomit.  You suddenly feel light-headed or dizzy.  Your heart begins to beat quickly, or it feels like it is skipping beats. These symptoms may represent a serious problem that is an emergency. Do not wait to see if the symptoms will go away. Get medical help right away. Call your local emergency services (911 in the [place] Do not drive yourself to the hospital.   This  information is not intended to replace advice given to you by your health care provider. Make sure you discuss any questions you have with your health care provider.   Document Released: 12/18/2004 Document Revised: 03/31/2014 Document Reviewed: 10/14/2013 Elsevier Interactive Patient Education 2016 [employer].  Palpitations A palpitation is the feeling that your heartbeat is irregular or is faster than normal. It may feel like your heart is fluttering or skipping a beat. Palpitations are usually not a serious problem. However, in some cases, you may need further medical evaluation. CAUSES  Palpitations can be caused by:  Smoking.  Caffeine or other stimulants, such as diet pills or energy drinks.  Alcohol.  Stress and anxiety.  Strenuous physical activity.  Fatigue.  Certain medicines.  Heart disease, especially if you have a history of irregular heart rhythms (arrhythmias), such as atrial fibrillation, atrial flutter, or supraventricular tachycardia.  An improperly working pacemaker or defibrillator. DIAGNOSIS  To find the cause of your palpitations, your health care provider will take your medical history and perform a physical exam. Your health care provider may also have you take a test called an ambulatory electrocardiogram (ECG). An ECG records your heartbeat patterns over a 24-hour  period. You may also have other tests, such as:  Transthoracic echocardiogram (TTE). During echocardiography, sound waves are used to evaluate how blood flows through your heart.  Transesophageal echocardiogram (TEE).  Cardiac monitoring. This allows your health care provider to monitor your heart rate and rhythm in real time.  Holter monitor. This is a portable device that records your heartbeat and can help diagnose heart arrhythmias. It allows your health care provider to track your heart activity for several days, if needed.  Stress tests by exercise or by giving medicine that makes the  heart beat faster. TREATMENT  Treatment of palpitations depends on the cause of your symptoms and can vary greatly. Most cases of palpitations do not require any treatment other than time, relaxation, and monitoring your symptoms. Other causes, such as atrial fibrillation, atrial flutter, or supraventricular tachycardia, usually require further treatment. HOME CARE INSTRUCTIONS   Avoid:  Caffeinated coffee, tea, soft drinks, diet pills, and energy drinks.  Chocolate.  Alcohol.  Stop smoking if you smoke.  Reduce your stress and anxiety. Things that can help you relax include:  A method of controlling things in your body, such as your heartbeats, with your mind (biofeedback).  Yoga.  Meditation.  Physical activity such as swimming, jogging, or walking.  Get plenty of rest and sleep. SEEK MEDICAL CARE IF:   You continue to have a fast or irregular heartbeat beyond 24 hours.  Your palpitations occur more often. SEEK IMMEDIATE MEDICAL CARE IF:  You have chest pain or shortness of breath.  You have a severe headache.  You feel dizzy or you faint. MAKE SURE YOU:  Understand these instructions.  Will watch your condition.  Will get help right away if you are not doing well or get worse.   This information is not intended to replace advice given to you by your health care provider. Make sure you discuss any questions you have with your health care provider.   Document Released: 03/07/2000 Document Revised: 03/15/2013 Document Reviewed: 05/09/2011 Elsevier Interactive Patient Education 2016 Elsevier Inc[employer]  Panic Attacks Panic attacks are sudden, short feelings of great fear or discomfort. You may have them for no reason when you are relaxed, when you are uneasy (anxious), or when you are sleeping.  HOME CARE  Take all your medicines as told.  Check with your doctor before starting new medicines.  Keep all doctor visits. GET HELP IF:  You are not able to take your  medicines as told.  Your symptoms do not get better.  Your symptoms get worse. GET HELP RIGHT AWAY IF:  Your attacks seem different than your normal attacks.  You have thoughts about hurting yourself or others.  You take panic attack medicine and you have a side effect. MAKE SURE YOU:  Understand these instructions.  Will watch your condition.  Will get help right away if you are not doing well or get worse.   This information is not intended to replace advice given to you by your health care provider. Make sure you discuss any questions you have with your health care provider.   Document Released: 04/12/2010 Document Revised: 12/29/2012 Document Reviewed: 10/22/2012 Elsevier Interactive Patient Education [employer].

## 2015-09-13 NOTE — Telephone Encounter (Signed)
Please make appointment for follow up.if he can come today for medication adjustment

## 2015-09-21 ENCOUNTER — Emergency Department
Admission: EM | Admit: 2015-09-21 | Discharge: 2015-09-21 | Disposition: A | Discharge status: Home / Self Care | Payer: Medicare Other | Attending: Emergency Medicine | Admitting: Emergency Medicine

## 2015-09-21 ENCOUNTER — Encounter: Payer: Self-pay | Admitting: Emergency Medicine

## 2015-09-21 DIAGNOSIS — Z85528 Personal history of other malignant neoplasm of kidney: Secondary | ICD-10-CM | POA: Insufficient documentation

## 2015-09-21 DIAGNOSIS — F17210 Nicotine dependence, cigarettes, uncomplicated: Secondary | ICD-10-CM | POA: Diagnosis not present

## 2015-09-21 DIAGNOSIS — E039 Hypothyroidism, unspecified: Secondary | ICD-10-CM | POA: Diagnosis not present

## 2015-09-21 DIAGNOSIS — F3130 Bipolar disorder, current episode depressed, mild or moderate severity, unspecified: Secondary | ICD-10-CM | POA: Insufficient documentation

## 2015-09-21 DIAGNOSIS — M5432 Sciatica, left side: Secondary | ICD-10-CM | POA: Diagnosis not present

## 2015-09-21 DIAGNOSIS — M545 Low back pain: Secondary | ICD-10-CM | POA: Diagnosis present

## 2015-09-21 MED ORDER — ETODOLAC 400 MG PO TABS: 400.0000 mg | ORAL_TABLET | Freq: Two times a day (BID) | ORAL | Status: AC

## 2015-09-21 NOTE — Discharge Instructions (Signed)
Call your pain clinic doctor today if needed. Pick up your scheduled pain medication tomorrow. You may begin etodolac 400 mg twice a day with food as needed for your sciatica. You also may use ice or heat to your left hip as needed for comfort. When sleeping in bed apply 2 pillows underneath your knees if lying on your back and one pillow between your  knees if lying on her side.

## 2015-09-21 NOTE — ED Notes (Signed)
Patient presents to the ED with "chronic back pain".  Patient states he normally takes 15mg  oxycodone but is out of medication until tomorrow.  Patient is a pain management clinic patient and sees Dr. Kary Kos for primary care.  Patient states, "my sciatica is really bad and it really hurts in my left leg."  Patient denies any new injury or pain.

## 2015-09-21 NOTE — ED Provider Notes (Signed)
[facility] Emergency Department Provider Note   ____________________________________________  Time seen: Approximately 10:26 AM  I have reviewed the triage vital signs and the nursing notes.   HISTORY  Chief Complaint Back Pain   HPI Zachary Reynolds is a 60 y.o. male is her complaint of low back pain that moves into his left leg. Patient states that currently he is being treated by the pain clinic in [facility] for his chronic back pain and takes oxycodone 15 mg which he is currently out of. Patient states that he went to [facility] to get his prescription and because he is early they will not fill his prescription. Patient states he has not called his primary care doctor or his chronic pain doctor about his medication. He is aware that he is early and states that he was taking extra oxycodone to get rid of his sciatica. He denies any incontinence of bowel or bladder. He does have radiation into his left leg but continues to be ambulatory without any assistance. Currently he rates his pain as 9 out of 10. He states that he took one over-the-counter Advil without any relief. He denies any recent injury.Patient states he has had sciatica in the past and he is unable take prednisone as it "makes his heart race".   Past Medical History  Diagnosis Date  . Renal disorder   . Slow urinary stream   . Hepatitis   . Constipation   . Cancer of right kidney ([place])   . Bipolar depression ([place])   . Chronic back pain   . ED (erectile dysfunction)   . Hematuria     gross  . Hypothyroidism   . Nephrolithiasis   . Elevated serum creatinine   . Testicular/scrotal pain   . Malaise   . BPH with obstruction/lower urinary tract symptoms   . Anxiety   . Arrhythmia   . Weak urinary stream     Patient Active Problem List   Diagnosis Date Noted  . Difficulty sleeping 05/01/2014  . 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 Has a tremor 02/22/2014    Past Surgical History    Procedure Laterality Date  . Tonsillectomy    . Hernia repair Bilateral   . Nephrectomy Right     Current Outpatient Rx  Name  Route  Sig  Dispense  Refill  . ALPRAZolam (XANAX) 1 MG tablet   Oral   Take 1 tablet (1 mg total) by mouth 2 (two) times daily.   30 tablet   1   . benztropine (COGENTIN) 0.5 MG tablet   Oral   Take 1 tablet (0.5 mg total) by mouth daily.   30 tablet   1   . etodolac (LODINE) 400 MG tablet   Oral   Take 1 tablet (400 mg total) by mouth 2 (two) times daily.   20 tablet   0   . fenofibrate 160 MG tablet   Oral   Take 160 mg by mouth daily.          Marland Kitchen gabapentin (NEURONTIN) 300 MG capsule   Oral   Take 300 mg by mouth 3 (three) times daily.          Marland Kitchen levothyroxine (SYNTHROID, LEVOTHROID) 88 MCG tablet   Oral   Take 88 mcg by mouth daily before breakfast.          . metoprolol succinate (TOPROL-XL) 50 MG 24 hr tablet   Oral   Take 50 mg by mouth daily.          Marland Kitchen  oxyCODONE (ROXICODONE) 15 MG immediate release tablet   Oral   Take 15 mg by mouth every 6 (six) hours as needed.          . traZODone (DESYREL) 100 MG tablet   Oral   Take 1 tablet (100 mg total) by mouth at bedtime as needed for sleep.   30 tablet   0   . traZODone (DESYREL) 50 MG tablet   Oral   Take 1 tablet (50 mg total) by mouth at bedtime.   30 tablet   0     Allergies Amlodipine; Haloperidol; Nucynta; and Prednisone  Family History  Problem Relation Age of Onset  . Prostate cancer Brother   . Thyroid disease Brother   . Stroke Mother   . Hypertension Mother   . Thyroid disease Mother   . Macular degeneration Father   . Thyroid disease Sister   . Thyroid disease Sister     Social History Social History  Substance Use Topics  . Smoking status: Current Every Day Smoker    Types: Cigarettes    Start date: 12/26/1974  . Smokeless tobacco: Never Used  . Alcohol Use: No    Review of Systems Constitutional: No fever/chills ENT: No sore  throat. Cardiovascular: Denies chest pain. Respiratory: Denies shortness of breath. Gastrointestinal: No abdominal pain.  No nausea, no vomiting.   Genitourinary: Negative for dysuria. Musculoskeletal: Positive for chronic back pain. Positive for left-sided sciatica. Skin: Negative for rash. Neurological: Negative for headaches, focal weakness, reported left sciatic pain.  10-point ROS otherwise negative.  ____________________________________________   PHYSICAL EXAM:  VITAL SIGNS: ED Triage Vitals  Enc Vitals Group     BP 09/21/15 1015 139/80 mmHg     Pulse Rate 09/21/15 1015 92     Resp 09/21/15 1015 18     Temp 09/21/15 1015 98.1 F (36.7 C)     Temp Source 09/21/15 1015 Oral     SpO2 09/21/15 1015 97 %     Weight 09/21/15 1015 222 lb (100.699 kg)     Height 09/21/15 1015 6\' 2"  (1.88 m)     Head Cir --      Peak Flow --      Pain Score 09/21/15 1015 9     Pain Loc --      Pain Edu? --      Excl. in [place]? --     Constitutional: Alert and oriented. Well appearing and in no acute distress. Eyes: Conjunctivae are normal. PERRL. EOMI. Head: Atraumatic. Nose: No congestion/rhinnorhea. Neck: No stridor.  Cardiovascular: Normal rate, regular rhythm. Grossly normal heart sounds.  Good peripheral circulation. Respiratory: Normal respiratory effort.  No retractions. Lungs CTAB. Gastrointestinal: Soft and nontender. No distention. Musculoskeletal: On examination of the back there is no gross deformity. There is mild soft tissue tenderness on the left SI joint area and paravertebral muscles. There is no active muscle spasm seen at this time. Patient had no difficulty getting from supine position to a sitting position without assistance. Normal gait was noted. Straight leg raise is were 90 with some discomfort on the left. Neurologic:  Normal speech and language. No gross focal neurologic deficits are appreciated. Reflexes are 2+ bilaterally. Skin:  Skin is warm, dry and intact. No  rash noted. Psychiatric: Mood and affect are normal. Speech and behavior are normal.  ____________________________________________   LABS (all labs ordered are listed, but only abnormal results are displayed)  Labs Reviewed - No data to display  PROCEDURES  Procedure(s) performed: None  Critical Care performed: No  ____________________________________________   INITIAL IMPRESSION / ASSESSMENT AND PLAN / ED COURSE  Pertinent labs & imaging results that were available during my care of the patient were reviewed by me and considered in my medical decision making (see chart for details).  Patient was made aware of our chronic pain policy which he states he is aware of. Patient was given a prescription for etodolac 400 mg twice a day for inflammation. He is also to call his primary care doctor Dr. Kary Kos if needed or his  pain management doctor in [place]. Patient was also encouraged to use ice or heat to his back as well as use pillows to elevate his legs when lying flat in the bed or one between his legs if lying on his side. ____________________________________________   FINAL CLINICAL IMPRESSION(S) / ED DIAGNOSES  Final diagnoses:  Left sided sciatica      NEW MEDICATIONS STARTED DURING THIS VISIT:  Discharge Medication List as of 09/21/2015 10:52 AM    START taking these medications   Details  etodolac (LODINE) 400 MG tablet Take 1 tablet (400 mg total) by mouth 2 (two) times daily., Starting 09/21/2015, Until Discontinued, Print         Note:  This document was prepared using Dragon voice recognition software and may include unintentional dictation errors.    Johnn Hai, PA-C 09/21/15 1111  Lavonia Drafts, MD 09/21/15 [phone]

## 2015-09-21 NOTE — ED Notes (Signed)
States he developed pain to lower back which moves into left leg  Ambulates well to treatment room denies any recent injury  Pt is currently out of oxycodone and is able to refill tomorrow

## 2015-09-30 ENCOUNTER — Encounter: Payer: Self-pay | Admitting: Emergency Medicine

## 2015-09-30 ENCOUNTER — Emergency Department
Admission: EM | Admit: 2015-09-30 | Discharge: 2015-09-30 | Disposition: A | Discharge status: Home / Self Care | Payer: Medicare Other | Attending: Emergency Medicine | Admitting: Emergency Medicine

## 2015-09-30 DIAGNOSIS — Z79899 Other long term (current) drug therapy: Secondary | ICD-10-CM | POA: Diagnosis not present

## 2015-09-30 DIAGNOSIS — E039 Hypothyroidism, unspecified: Secondary | ICD-10-CM | POA: Diagnosis not present

## 2015-09-30 DIAGNOSIS — F17210 Nicotine dependence, cigarettes, uncomplicated: Secondary | ICD-10-CM | POA: Diagnosis not present

## 2015-09-30 DIAGNOSIS — F419 Anxiety disorder, unspecified: Secondary | ICD-10-CM | POA: Diagnosis present

## 2015-09-30 DIAGNOSIS — F3130 Bipolar disorder, current episode depressed, mild or moderate severity, unspecified: Secondary | ICD-10-CM | POA: Insufficient documentation

## 2015-09-30 MED ORDER — LORAZEPAM 2 MG/ML IJ SOLN
2.0000 mg | Freq: Once | INTRAMUSCULAR | Status: AC
  Administered 2015-09-30: 2 mg via INTRAMUSCULAR
  Filled 2015-09-30: qty 1

## 2015-09-30 NOTE — ED Provider Notes (Signed)
[facility] Emergency Department Provider Note   ____________________________________________  Time seen: Approximately 1:32 AM  I have reviewed the triage vital signs and the nursing notes.   HISTORY  Chief Complaint Anxiety    HPI Zachary Reynolds is a 60 y.o. male who presents to the ED from home via EMS with a chief complaint anxiety. Patient reports being out of his Xanax since last night. States both of his elderly parents health are failing and that his mother was recently given 6-8 months to live. Denies SI/HI/AH/VH.States he had palpitations earlier but not currently. Denies recent fever, chills, chest pain, shortness of breath, abdominal pain, nausea, vomiting, diarrhea. Denies recent travel or trauma. Denies excessive caffeine use. Nothing makes his symptoms better or worse.   Past Medical History  Diagnosis Date  . Renal disorder   . Slow urinary stream   . Hepatitis   . Constipation   . Cancer of right kidney ([place])   . Bipolar depression ([place])   . Chronic back pain   . ED (erectile dysfunction)   . Hematuria     gross  . Hypothyroidism   . Nephrolithiasis   . Elevated serum creatinine   . Testicular/scrotal pain   . Malaise   . BPH with obstruction/lower urinary tract symptoms   . Anxiety   . Arrhythmia   . Weak urinary stream     Patient Active Problem List   Diagnosis Date Noted  . Difficulty sleeping 05/01/2014  . 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 Has a tremor 02/22/2014    Past Surgical History  Procedure Laterality Date  . Tonsillectomy    . Hernia repair Bilateral   . Nephrectomy Right     Current Outpatient Rx  Name  Route  Sig  Dispense  Refill  . ALPRAZolam (XANAX) 1 MG tablet   Oral   Take 1 tablet (1 mg total) by mouth 2 (two) times daily.   30 tablet   1   . benztropine (COGENTIN) 0.5 MG tablet   Oral   Take 1 tablet (0.5 mg total) by mouth daily.   30 tablet   1   . etodolac (LODINE) 400 MG  tablet   Oral   Take 1 tablet (400 mg total) by mouth 2 (two) times daily.   20 tablet   0   . fenofibrate 160 MG tablet   Oral   Take 160 mg by mouth daily.          Marland Kitchen gabapentin (NEURONTIN) 300 MG capsule   Oral   Take 300 mg by mouth 3 (three) times daily.          Marland Kitchen levothyroxine (SYNTHROID, LEVOTHROID) 88 MCG tablet   Oral   Take 88 mcg by mouth daily before breakfast.          . metoprolol succinate (TOPROL-XL) 50 MG 24 hr tablet   Oral   Take 50 mg by mouth daily.          Marland Kitchen oxyCODONE (ROXICODONE) 15 MG immediate release tablet   Oral   Take 15 mg by mouth every 6 (six) hours as needed.          Marland Kitchen QUEtiapine (SEROQUEL) 200 MG tablet   Oral   Take 1 tablet by mouth daily.         . sertraline (ZOLOFT) 25 MG tablet   Oral   Take 1 tablet by mouth daily.         Marland Kitchen  traZODone (DESYREL) 100 MG tablet   Oral   Take 1 tablet (100 mg total) by mouth at bedtime as needed for sleep.   30 tablet   0   . traZODone (DESYREL) 50 MG tablet   Oral   Take 1 tablet (50 mg total) by mouth at bedtime.   30 tablet   0     Allergies Amlodipine; Haloperidol; Nucynta; and Prednisone  Family History  Problem Relation Age of Onset  . Prostate cancer Brother   . Thyroid disease Brother   . Stroke Mother   . Hypertension Mother   . Thyroid disease Mother   . Macular degeneration Father   . Thyroid disease Sister   . Thyroid disease Sister     Social History Social History  Substance Use Topics  . Smoking status: Current Every Day Smoker    Types: Cigarettes    Start date: 12/26/1974  . Smokeless tobacco: Never Used  . Alcohol Use: No    Review of Systems  Constitutional: No fever/chills. Eyes: No visual changes. ENT: No sore throat. Cardiovascular: Denies chest pain. Respiratory: Denies shortness of breath. Gastrointestinal: No abdominal pain.  No nausea, no vomiting.  No diarrhea.  No constipation. Genitourinary: Negative for  dysuria. Musculoskeletal: Negative for back pain. Skin: Negative for rash. Neurological: Negative for headaches, focal weakness or numbness. Psychiatric:Positive for anxiety.   10-point ROS otherwise negative.  ____________________________________________   PHYSICAL EXAM:  VITAL SIGNS: ED Triage Vitals  Enc Vitals Group     BP 09/30/15 0015 160/90 mmHg     Pulse Rate 09/30/15 0015 64     Resp 09/30/15 0015 18     Temp 09/30/15 0015 98.6 F (37 C)     Temp Source 09/30/15 0015 Oral     SpO2 09/30/15 0015 96 %     Weight 09/30/15 0015 220 lb (99.791 kg)     Height 09/30/15 0015 6\' 2"  (1.88 m)     Head Cir --      Peak Flow --      Pain Score 09/30/15 0021 6     Pain Loc --      Pain Edu? --      Excl. in [place]? --     Constitutional: Alert and oriented. Well appearing and in mild acute distress. Tearful and anxious. Eyes: Conjunctivae are normal. PERRL. EOMI. Head: Atraumatic. Nose: No congestion/rhinnorhea. Mouth/Throat: Mucous membranes are moist.  Oropharynx non-erythematous. Neck: No stridor.   Cardiovascular: Normal rate, regular rhythm. Grossly normal heart sounds.  Good peripheral circulation. Respiratory: Normal respiratory effort.  No retractions. Lungs CTAB. Gastrointestinal: Soft and nontender. No distention. No abdominal bruits. No CVA tenderness. Musculoskeletal: No lower extremity tenderness nor edema.  No joint effusions. Neurologic:  Normal speech and language. No gross focal neurologic deficits are appreciated. No gait instability. Skin:  Skin is warm, dry and intact. No rash noted. Psychiatric: Mood and affect are normal. Speech and behavior are normal.  ____________________________________________   LABS (all labs ordered are listed, but only abnormal results are displayed)  Labs Reviewed - No data to display ____________________________________________  EKG  ED ECG REPORT I, Jamayah Myszka J, the attending physician, personally viewed and  interpreted this ECG.   Date: 09/30/2015  EKG Time: 0028  Rate: 65  Rhythm: normal EKG, normal sinus rhythm  Axis: Normal  Intervals:none  ST&T Change: Nonspecific  ____________________________________________  RADIOLOGY  None ____________________________________________   PROCEDURES  Procedure(s) performed: None  Procedures  Critical Care performed: No  ____________________________________________   INITIAL IMPRESSION / ASSESSMENT AND PLAN / ED COURSE  Pertinent labs & imaging results that were available during my care of the patient were reviewed by me and considered in my medical decision making (see chart for details).  60 year old male who presents for evaluation of anxiety. Denies SI. He is tearful and shaking with anxiety. Refuses lab work but excepts intramuscular Ativan. Will consult TTS to evaluate patient in the emergency department.  ----------------------------------------- 3:30 AM on 09/30/2015 -----------------------------------------  Patient was evaluated by TTS counselor Zachary Reynolds. Denies SI/HI/AH/VH. Turns out patient's psychiatrist, Dr. Gretel Reynolds, is currently attempting to wean patient off benzodiazepines. For this reason, I will not write a prescription. Patient has an appointment tomorrow with psychiatry. I encouraged him to keep this appointment. Strict return precautions given. Patient verbalizes understanding and agrees with plan of care. ____________________________________________   FINAL CLINICAL IMPRESSION(S) / ED DIAGNOSES  Final diagnoses:  Anxiety      NEW MEDICATIONS STARTED DURING THIS VISIT:  Discharge Medication List as of 09/30/2015  3:28 AM       Note:  This document was prepared using Dragon voice recognition software and may include unintentional dictation errors.    Paulette Blanch, MD 09/30/15 [phone]

## 2015-09-30 NOTE — Discharge Instructions (Signed)
Keep your appointment on Monday with her psychiatrist. Return to the ER for worsening symptoms, persistent vomiting, difficulty breathing or other concerns.  Panic Attacks Panic attacks are sudden, short-livedsurges of severe anxiety, fear, or discomfort. They may occur for no reason when you are relaxed, when you are anxious, or when you are sleeping. Panic attacks may occur for a number of reasons:   Healthy people occasionally have panic attacks in extreme, life-threatening situations, such as war or natural disasters. Normal anxiety is a protective mechanism of the body that helps [place] react to danger (fight or flight response).  Panic attacks are often seen with anxiety disorders, such as panic disorder, social anxiety disorder, generalized anxiety disorder, and phobias. Anxiety disorders cause excessive or uncontrollable anxiety. They may interfere with your relationships or other life activities.  Panic attacks are sometimes seen with other mental illnesses, such as depression and posttraumatic stress disorder.  Certain medical conditions, prescription medicines, and drugs of abuse can cause panic attacks. SYMPTOMS  Panic attacks start suddenly, peak within 20 minutes, and are accompanied by four or more of the following symptoms:  Pounding heart or fast heart rate (palpitations).  Sweating.  Trembling or shaking.  Shortness of breath or feeling smothered.  Feeling choked.  Chest pain or discomfort.  Nausea or strange feeling in your stomach.  Dizziness, light-headedness, or feeling like you will faint.  Chills or hot flushes.  Numbness or tingling in your lips or hands and feet.  Feeling that things are not real or feeling that you are not yourself.  Fear of losing control or going crazy.  Fear of dying. Some of these symptoms can mimic serious medical conditions. For example, you may think you are having a heart attack. Although panic attacks can be very scary, they  are not life threatening. DIAGNOSIS  Panic attacks are diagnosed through an assessment by your health care provider. Your health care provider will ask questions about your symptoms, such as where and when they occurred. Your health care provider will also ask about your medical history and use of alcohol and drugs, including prescription medicines. Your health care provider may order blood tests or other studies to rule out a serious medical condition. Your health care provider may refer you to a mental health professional for further evaluation. TREATMENT   Most healthy people who have one or two panic attacks in an extreme, life-threatening situation will not require treatment.  The treatment for panic attacks associated with anxiety disorders or other mental illness typically involves counseling with a mental health professional, medicine, or a combination of both. Your health care provider will help determine what treatment is best for you.  Panic attacks due to physical illness usually go away with treatment of the illness. If prescription medicine is causing panic attacks, talk with your health care provider about stopping the medicine, decreasing the dose, or substituting another medicine.  Panic attacks due to alcohol or drug abuse go away with abstinence. Some adults need professional help in order to stop drinking or using drugs. HOME CARE INSTRUCTIONS   Take all medicines as directed by your health care provider.   Schedule and attend follow-up visits as directed by your health care provider. It is important to keep all your appointments. SEEK MEDICAL CARE IF:  You are not able to take your medicines as prescribed.  Your symptoms do not improve or get worse. SEEK IMMEDIATE MEDICAL CARE IF:   You experience panic attack symptoms that are different  than your usual symptoms.  You have serious thoughts about hurting yourself or others.  You are taking medicine for panic attacks  and have a serious side effect. MAKE SURE YOU:  Understand these instructions.  Will watch your condition.  Will get help right away if you are not doing well or get worse.   This information is not intended to replace advice given to you by your health care provider. Make sure you discuss any questions you have with your health care provider.   Document Released: 03/10/2005 Document Revised: 03/15/2013 Document Reviewed: 10/22/2012 Elsevier Interactive Patient Education 2016 [employer]  Generalized Anxiety Disorder Generalized anxiety disorder (GAD) is a mental disorder. It interferes with life functions, including relationships, work, and school. GAD is different from normal anxiety, which everyone experiences at some point in their lives in response to specific life events and activities. Normal anxiety actually helps [place] prepare for and get through these life events and activities. Normal anxiety goes away after the event or activity is over.  GAD causes anxiety that is not necessarily related to specific events or activities. It also causes excess anxiety in proportion to specific events or activities. The anxiety associated with GAD is also difficult to control. GAD can vary from mild to severe. People with severe GAD can have intense waves of anxiety with physical symptoms (panic attacks).  SYMPTOMS The anxiety and worry associated with GAD are difficult to control. This anxiety and worry are related to many life events and activities and also occur more days than not for 6 months or longer. People with GAD also have three or more of the following symptoms (one or more in children):  Restlessness.   Fatigue.  Difficulty concentrating.   Irritability.  Muscle tension.  Difficulty sleeping or unsatisfying sleep. DIAGNOSIS GAD is diagnosed through an assessment by your health care provider. Your health care provider will ask you questions aboutyour mood,physical symptoms,  and events in your life. Your health care provider may ask you about your medical history and use of alcohol or drugs, including prescription medicines. Your health care provider may also do a physical exam and blood tests. Certain medical conditions and the use of certain substances can cause symptoms similar to those associated with GAD. Your health care provider may refer you to a mental health specialist for further evaluation. TREATMENT The following therapies are usually used to treat GAD:   Medication. Antidepressant medication usually is prescribed for long-term daily control. Antianxiety medicines may be added in severe cases, especially when panic attacks occur.   Talk therapy (psychotherapy). Certain types of talk therapy can be helpful in treating GAD by providing support, education, and guidance. A form of talk therapy called cognitive behavioral therapy can teach you healthy ways to think about and react to daily life events and activities.  Stress managementtechniques. These include yoga, meditation, and exercise and can be very helpful when they are practiced regularly. A mental health specialist can help determine which treatment is best for you. Some people see improvement with one therapy. However, other people require a combination of therapies.   This information is not intended to replace advice given to you by your health care provider. Make sure you discuss any questions you have with your health care provider.   Document Released: 07/05/2012 Document Revised: 03/31/2014 Document Reviewed: 07/05/2012 Elsevier Interactive Patient Education [employer].

## 2015-09-30 NOTE — BH Assessment (Signed)
Assessment Note  Zachary Reynolds is an 60 y.o. male who presents to the ER via EMS due to having anxiety. Symptoms included increase heart palpations, hot and cold chills and shakes. Per his report, the main factor that is causing the anxiety, he's worried about his mother. He states, he's the primary caregiver for his parents. His mother was recently giving "6 to 12 months to live." She's been diagnosed with Pancreatic Cancer and it has spread to her liver.  Patient also shares he is out of his Xanax. He referred to it throughout the interview. He was with [name] (Psychiatrist) from 2010 to 2013. He was being prescribed 6 mg a day or 2mg  TID. [name] had him on another medication that "was $75 and he wrote the prescription, if I didn't get it filled I couldn't get the Xanax." He became upset about that. "It was my $75 and if I didn't want to buy it, I couldn't get my Xanax and that was wrong." He then moved his care to [name] and was with him from 2013 to 2016. It is unclear as to why he stop seeing him. He is currently under the Psychiatric care of [name] He states she is out on medical leave and he is seeing [name] in the meantime. He's been tapered down from the Xanax and currently taking 1mg  BID.  He states the only thing that helps him sleep is Seroquel and Xanax. When asked about his sleep, he initially stated it was bad. Then he stated it was good, until tonight. It is due in part, because he no longer have the Xanax. "I ran out but I have one refill." [name] has prescribed him Trazadone for sleep but he state, it hasn't helped. "I took and look. I'm still up."  During the interview, the patient complained about how miserable he was and how his anxiety has increased. When writer walked into the room, patient was steady and able to left his self up in the bed. After Probation officer introduced who he was and that he was with behavioral medicine, his  left hand began to shake and started reporting what his symptoms were. When asked about how often his symptoms occur, he was unable to share. While sharing this, the patients right hand wasn't moving and by his side, on the bed. After writer asked him, does his shaking only happens with is right hand, he replied no its both of them and his right hand started shaking. He further shared his right hand doesn't shake as bad as the left hand.  Writer asked him several times throughout the interview about SI/HI and AV/H. Each time he denied having any active SI/HI and AV/H. He also denied having Self Injurious Behaviors. He stated he was inpatient three times. The last inpatient stay was 06/2010. Each hospitalization was with [facility] and was admitted due to depression. During that time he was having SI with no plan, intentions or gestures. He denied having any past attempts or gestures in his lifetime as well.  Patient states, his next appointment with his psychiatrist is scheduled for 10/01/2015.  Patient denies current involvement with the court system and with DSS. He denies having a history of aggression and violence. He also denies any use of mind-altering substance.  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 Renal disorder   . Slow urinary stream   .  Hepatitis   . Constipation   . Cancer of right kidney ([place])   . Bipolar depression ([place])   . Chronic back pain   . ED (erectile dysfunction)   . Hematuria     gross  . Hypothyroidism   . Nephrolithiasis   . Elevated serum creatinine   . Testicular/scrotal pain   . Malaise   . BPH with obstruction/lower urinary tract symptoms   . Anxiety   . Arrhythmia   . Weak urinary stream     Past Surgical History  Procedure Laterality Date  . Tonsillectomy    . Hernia repair Bilateral   . Nephrectomy Right     Family History:  Family History  Problem Relation Age of  Onset  . Prostate cancer Brother   . Thyroid disease Brother   . Stroke Mother   . Hypertension Mother   . Thyroid disease Mother   . Macular degeneration Father   . Thyroid disease Sister   . Thyroid disease Sister     Social History:  reports that he has been smoking Cigarettes.  He started smoking about 40 years ago. He has never used smokeless tobacco. He reports that he does not drink alcohol or use illicit drugs.  Additional Social History:  Alcohol / Drug Use Pain Medications: See PTA Prescriptions: See PTA Over the Counter: See PTA History of alcohol / drug use?: No history of alcohol / drug abuse Longest period of sobriety (when/how long): Denies past or current use of mind-altering substances  Negative Consequences of Use:  (Reports of none) Withdrawal Symptoms:  (Reports of none)  CIWA: CIWA-Ar BP: (!) 164/90 mmHg Pulse Rate: 64 COWS:    Allergies:  Allergies  Allergen Reactions  . Amlodipine     Other reaction(s): Other (See Comments) Hard heart beat, insomnia  . Haloperidol Other (See Comments)  . Nucynta [Tapentadol]   . Prednisone     Home Medications:  (Not in a hospital admission)  OB/GYN Status:  No LMP for male patient.  General Assessment Data Location of Assessment: [facility] ED TTS Assessment: In system Is this a Tele or Face-to-Face Assessment?: Face-to-Face Is this an Initial Assessment or a Re-assessment for this encounter?: Initial Assessment Marital status: Single Maiden name: n/a Is patient pregnant?: No Pregnancy Status: No Living Arrangements: Alone Can pt return to current living arrangement?: Yes Admission Status: Voluntary Is patient capable of signing voluntary admission?: Yes Referral Source: Self/Family/Friend Insurance type: [facility] Medicare  Medical Screening Exam ([place]) Medical Exam completed: Yes  Crisis Care Plan Living Arrangements: Alone Legal Guardian: Other: (Reports) Name of Psychiatrist: [name]  ([place]) Name of Therapist: Reports of none  Education Status Is patient currently in school?: No Current Grade: n/a Highest grade of school patient has completed: [employer] Diploma Name of school: n/a Contact person: n/a  Risk to self with the past 6 months Suicidal Ideation: No Has patient been a risk to self within the past 6 months prior to admission? : No Suicidal Intent: No Has patient had any suicidal intent within the past 6 months prior to admission? : No Is patient at risk for suicide?: No Suicidal Plan?: No Has patient had any suicidal plan within the past 6 months prior to admission? : No Access to Means: No What has been your use of drugs/alcohol within the last 12 months?: Reports of none Previous Attempts/Gestures: No How many times?: 0 Other Self Harm Risks: Reports of none Triggers for Past Attempts: None  known Intentional Self Injurious Behavior: None Family Suicide History: No Recent stressful life event(s): Other (Comment), Recent negative physical changes (Doctor recently told him, his mother 6 to 12 months to live.) Persecutory voices/beliefs?: No Depression: Yes Depression Symptoms: Feeling worthless/self pity, Feeling angry/irritable, Loss of interest in usual pleasures, Guilt, Isolating, Tearfulness Substance abuse history and/or treatment for substance abuse?: No Suicide prevention information given to non-admitted patients: Not applicable  Risk to Others within the past 6 months Homicidal Ideation: No Does patient have any lifetime risk of violence toward others beyond the six months prior to admission? : No Thoughts of Harm to Others: No Current Homicidal Intent: No Current Homicidal Plan: No Access to Homicidal Means: No Identified Victim: Reports of none History of harm to others?: No Assessment of Violence: None Noted Violent Behavior Description: Reports of none Does patient have access to weapons?: Yes (Comment)  ("[address]") Criminal Charges Pending?: No Does patient have a court date: No Is patient on probation?: No  Psychosis Hallucinations: None noted Delusions: None noted  Mental Status Report Appearance/Hygiene: In hospital gown, In scrubs, Unremarkable Eye Contact: Fair Motor Activity: Freedom of movement, Unremarkable Speech: Logical/coherent, Unremarkable Level of Consciousness: Alert, Irritable Mood: Anxious, Sad, Pleasant, Irritable, Helpless, Guilty Affect: Appropriate to circumstance, Depressed, Irritable Anxiety Level: Minimal Thought Processes: Coherent, Relevant Judgement: Unimpaired Orientation: Person, Place, Time, Appropriate for developmental age, Situation Obsessive Compulsive Thoughts/Behaviors: Minimal  Cognitive Functioning Concentration: Normal Memory: Recent Intact, Remote Intact IQ: Average Insight: Fair Impulse Control: Fair Appetite: Fair Weight Loss: 10 (Within the last 3 months) Weight Gain: 0 Sleep: No Change Total Hours of Sleep: 8 Vegetative Symptoms: None  ADLScreening [facility] Assessment Services) Patient's cognitive ability adequate to safely complete daily activities?: Yes Patient able to express need for assistance with ADLs?: Yes Independently performs ADLs?: Yes (appropriate for developmental age)  Prior Inpatient Therapy Prior Inpatient Therapy: Yes Prior Therapy Dates: 06/2014, 10/2010 & 12/2009 Prior Therapy Facilty/Provider(s): [facility] Reason for Treatment: Depression  Prior Outpatient Therapy Prior Outpatient Therapy: Yes Prior Therapy Dates: Current Prior Therapy Facilty/Provider(s): [facility] Reason for Treatment: Depression Does patient have an ACCT team?: No Does patient have Intensive In-House Services?  : No Does patient have [facility] services? : No Does patient have P4CC services?: No  ADL Screening (condition at time of admission) Patient's cognitive ability adequate to safely complete daily  activities?: Yes Is the patient deaf or have difficulty hearing?: No Does the patient have difficulty seeing, even when wearing glasses/contacts?: No Does the patient have difficulty concentrating, remembering, or making decisions?: No Patient able to express need for assistance with ADLs?: Yes Does the patient have difficulty dressing or bathing?: No Independently performs ADLs?: Yes (appropriate for developmental age) Does the patient have difficulty walking or climbing stairs?: No Weakness of Legs: None Weakness of Arms/Hands: None  Home Assistive Devices/Equipment Home Assistive Devices/Equipment: None  Therapy Consults (therapy consults require a physician order) PT Evaluation Needed: No OT Evalulation Needed: No SLP Evaluation Needed: No Abuse/Neglect Assessment (Assessment to be complete while patient is alone) Physical Abuse: Denies Verbal Abuse: Denies Sexual Abuse: Denies Exploitation of patient/patient's resources: Denies Self-Neglect: Denies Values / Beliefs Cultural Requests During Hospitalization: None Spiritual Requests During Hospitalization: None Consults Spiritual Care Consult Needed: No Social Work Consult Needed: No Regulatory affairs officer (For Healthcare) Does patient have an advance directive?: No Would patient like information on creating an advanced directive?: No - patient declined information    Additional Information 1:1 In Past 12 Months?: No  CIRT Risk: No Elopement Risk: No Does patient have medical clearance?: Yes  Child/Adolescent Assessment Running Away Risk: Denies (Patient is an adult)  Disposition:  Disposition Initial Assessment Completed for this Encounter: Yes Disposition of Patient: Other dispositions  On Site Evaluation by:   Reviewed with Physician:    Gunnar Fusi MS, LCAS, [place], [place], CCSI Therapeutic Triage Specialist 09/30/2015 3:58 AM

## 2015-09-30 NOTE — ED Notes (Signed)
Per [facility] pt called 911 for anxiety. EMS reports BP 150/110, NSR on the monitor, and PR low 60's. Pt reports being out of his xanax since last night. Pt states that it was "too much". Pt is alert and oriented with NAD at this time.

## 2015-10-01 ENCOUNTER — Ambulatory Visit: Payer: Medicare Other | Admitting: Psychiatry

## 2015-10-01 ENCOUNTER — Encounter: Payer: Self-pay | Admitting: Psychiatry

## 2015-10-01 ENCOUNTER — Ambulatory Visit (INDEPENDENT_AMBULATORY_CARE_PROVIDER_SITE_OTHER): Payer: 59 | Admitting: Psychiatry

## 2015-10-01 VITALS — BP 122/84 | HR 78 | Temp 97.3°F | Ht 74.0 in | Wt 220.8 lb

## 2015-10-01 DIAGNOSIS — F13230 Sedative, hypnotic or anxiolytic dependence with withdrawal, uncomplicated: Secondary | ICD-10-CM

## 2015-10-01 DIAGNOSIS — F332 Major depressive disorder, recurrent severe without psychotic features: Secondary | ICD-10-CM

## 2015-10-01 DIAGNOSIS — F13930 Sedative, hypnotic or anxiolytic use, unspecified with withdrawal, uncomplicated: Secondary | ICD-10-CM

## 2015-10-01 MED ORDER — QUETIAPINE FUMARATE 200 MG PO TABS: 200.0000 mg | ORAL_TABLET | Freq: Every day | ORAL | Status: DC

## 2015-10-01 MED ORDER — VENLAFAXINE HCL ER 75 MG PO CP24: 75.0000 mg | ORAL_CAPSULE | Freq: Every day | ORAL | Status: DC

## 2015-10-01 NOTE — Progress Notes (Signed)
Patient ID: Zachary Reynolds, male   DOB: [birth year], 60 y.o.   MRN: [id number]  [facility] MD/PA/NP OP Progress Note  10/01/2015 2:14 PM INDIA GIRGIS  MRN:  [id number]  Subjective:  Patient is a 60 year old male with history of benzodiazepine dependence presented for the follow-up appointment. He reported that his mother has recently been diagnosed with pancreatic cancer. She is 60 years old. He appeared apprehensive and anxious to do the same. He reported that he and will feel very depressed if she will pass. Stated that he wants to prepare himself for the demise of his mother and wants his medications to be adjusted. He has been compliant with his medications. He is not taking Xanax twice daily. He reported that he is not taking more than the more prescribed and has refilled his then today. He appeared anxious during the interview. He currently denied having any suicidal homicidal ideations or plans. Patient reported that her medications are helping him and he has started taking the Seroquel at bedtime to help him sleep. He has been sleeping well at night. He currently denied having any mood swings anger anxiety or paranoia. We discussed about the medications in detail and he acknowledged.     Chief Complaint:  Chief Complaint    Anxiety     Visit Diagnosis:     ICD-9-CM ICD-10-CM   1. Major depressive disorder, recurrent, severe without psychotic features ([place]) 296.33 F33.2   2. 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 Renal disorder   . Slow urinary stream   . Hepatitis   . Constipation   . Cancer of right kidney ([place])   . Bipolar depression ([place])   . Chronic back pain   . ED (erectile dysfunction)   . Hematuria     gross  . Hypothyroidism   . Nephrolithiasis   . Elevated serum creatinine   . Testicular/scrotal pain   . Malaise   . BPH with obstruction/lower urinary tract symptoms    . Anxiety   . Arrhythmia   . Weak urinary stream     Past Surgical History  Procedure Laterality Date  . Tonsillectomy    . Hernia repair Bilateral   . Nephrectomy Right    Family History:  Family History  Problem Relation Age of Onset  . Prostate cancer Brother   . Thyroid disease Brother   . Stroke Mother   . Hypertension Mother   . Thyroid disease Mother   . Macular degeneration Father   . Thyroid disease Sister   . Thyroid disease Sister    Social History:  Social History   Social History  . Marital Status: Single    Spouse Name: N/A  . Number of Children: N/A  . Years of Education: N/A   Social History Main Topics  . Smoking status: Current Every Day Smoker    Types: Cigarettes    Start date: 12/26/1974  . Smokeless tobacco: Never Used  . Alcohol Use: No  . Drug Use: No  . Sexual Activity: Not Currently   Other Topics Concern  . Not on file   Social History Narrative   Additional History:   Assessment:   Musculoskeletal: Strength & Muscle Tone: within normal limits Gait & Station: normal Patient leans: N/A  Psychiatric Specialty Exam: Anxiety Symptoms include insomnia and nervous/anxious behavior.    Insomnia PMH includes: depression (as has  largely remained at baseline and not worsened).  Depression        Associated symptoms include insomnia.  Past medical history includes anxiety.     Review of Systems  Constitutional: Negative.   Eyes: Negative.   Respiratory: Negative.   Cardiovascular: Negative.   Gastrointestinal: Negative.   Genitourinary: Negative.   Musculoskeletal: Positive for back pain and neck pain.  Skin: Negative.   Neurological: Positive for tremors.  Endo/Heme/Allergies: Negative.   Psychiatric/Behavioral: Positive for depression (as has largely remained at baseline and not worsened) and substance abuse. The patient is nervous/anxious and has insomnia.   All other systems reviewed and are negative.   Blood pressure  122/84, pulse 78, temperature 97.3 F (36.3 C), temperature source Tympanic, height 6\' 2"  (1.88 m), weight 220 lb 12.8 oz (100.154 kg), SpO2 94 %.Body mass index is 28.34 kg/(m^2).  General Appearance: Well Groomed  Eye Contact:  Good  Speech:  Slightly slow rate  Volume:  Normal  Mood:  Anxious and Dysphoric  Affect:  Constricted  Thought Process:  Logical  Orientation:  Full (Time, Place, and Person)  Thought Content:  Negative  Suicidal Thoughts:  No  Homicidal Thoughts:  No  Memory:  Immediate;   Good Recent;   Good Remote;   Good  Judgement:  Good  Insight:  Good  Psychomotor Activity:  Negative  Concentration:  Good  Recall:  Good  Fund of Knowledge: Good  Language: Good  Akathisia:  Negative  Handed:    AIMS (if indicated):  Cogwheeling rigidity present in both arms   Assets:  Communication Skills Desire for Improvement  ADL's:  Intact  Cognition: WNL  Sleep:  Okay with trazodone   Is the patient at risk to self?  No. Has the patient been a risk to self in the past 6 months?  No. Has the patient been a risk to self within the distant past?  No. Is the patient a risk to others?  No. Has the patient been a risk to others in the past 6 months?  No. Has the patient been a risk to others within the distant past?  No.  Current Medications: Current Outpatient Prescriptions  Medication Sig Dispense Refill  . ALPRAZolam (XANAX) 1 MG tablet Take 1 tablet (1 mg total) by mouth 2 (two) times daily. 30 tablet 1  . benztropine (COGENTIN) 0.5 MG tablet Take 1 tablet (0.5 mg total) by mouth daily. 30 tablet 1  . etodolac (LODINE) 400 MG tablet Take 1 tablet (400 mg total) by mouth 2 (two) times daily. 20 tablet 0  . fenofibrate 160 MG tablet Take 160 mg by mouth daily.     Marland Kitchen gabapentin (NEURONTIN) 300 MG capsule Take 300 mg by mouth 3 (three) times daily.     Marland Kitchen levothyroxine (SYNTHROID, LEVOTHROID) 88 MCG tablet Take 88 mcg by mouth daily before breakfast.     . metoprolol  succinate (TOPROL-XL) 50 MG 24 hr tablet Take 50 mg by mouth daily.     Marland Kitchen oxyCODONE (ROXICODONE) 15 MG immediate release tablet Take 15 mg by mouth every 6 (six) hours as needed.     Marland Kitchen QUEtiapine (SEROQUEL) 200 MG tablet Take 1 tablet by mouth daily.    . sertraline (ZOLOFT) 25 MG tablet Take 1 tablet by mouth daily.    . traZODone (DESYREL) 100 MG tablet Take 1 tablet (100 mg total) by mouth at bedtime as needed for sleep. 30 tablet 0  . traZODone (DESYREL) 50 MG tablet  Take 1 tablet (50 mg total) by mouth at bedtime. 30 tablet 0   No current facility-administered medications for this visit.    Medical Decision Making:  Established Problem, Stable/Improving (1), New Problem, with no additional work-up planned (3), Review of Medication Regimen & Side Effects (2) and Review of New Medication or Change in Dosage (2)  Treatment Plan Summary:Medication management and Plan   Discussed with patient about his medications Continue Seroquel 200 mg at bedtime. I will start him on Effexor XR 75 mg in the morning for his anxiety and he agreed with the plan. We will also start therapy with Miguel Dibble.    More than 50% of the time spent in psychoeducation, counseling and coordination of care.    This note was generated in part or whole with voice recognition software. Voice regonition is usually quite accurate but there are transcription errors that can and very often do occur. I apologize for any typographical errors that were not detected and corrected.   Rainey Pines, MD  10/01/2015, 2:14 PM

## 2015-10-09 ENCOUNTER — Ambulatory Visit: Payer: Medicare Other | Admitting: Psychiatry

## 2015-10-11 ENCOUNTER — Other Ambulatory Visit: Payer: Self-pay

## 2015-10-11 ENCOUNTER — Other Ambulatory Visit: Payer: Self-pay | Admitting: Psychiatry

## 2015-10-11 NOTE — Telephone Encounter (Signed)
pt needs rx for xanax.  you only gave him a 15 day supply.  1mg  bid # 30 and it suppose to be #60

## 2015-10-15 ENCOUNTER — Other Ambulatory Visit: Payer: Self-pay

## 2015-10-15 NOTE — Telephone Encounter (Signed)
Zachary Reynolds is out of his xanax 1mg  pt has appt for tomorrow 10-16-15. can a rx be sent in for just two tablet to make due until he comes in tomorrow

## 2015-10-15 NOTE — Telephone Encounter (Signed)
I called in a prescription just as requested and called the patient and spoke to him and let him know that it was done.

## 2015-10-16 ENCOUNTER — Encounter: Payer: Self-pay | Admitting: Psychiatry

## 2015-10-16 ENCOUNTER — Ambulatory Visit (INDEPENDENT_AMBULATORY_CARE_PROVIDER_SITE_OTHER): Payer: 59 | Admitting: Psychiatry

## 2015-10-16 VITALS — BP 120/74 | HR 79 | Temp 98.7°F | Ht 74.0 in | Wt 220.2 lb

## 2015-10-16 DIAGNOSIS — F332 Major depressive disorder, recurrent severe without psychotic features: Secondary | ICD-10-CM

## 2015-10-16 DIAGNOSIS — F13930 Sedative, hypnotic or anxiolytic use, unspecified with withdrawal, uncomplicated: Secondary | ICD-10-CM

## 2015-10-16 DIAGNOSIS — F13230 Sedative, hypnotic or anxiolytic dependence with withdrawal, uncomplicated: Secondary | ICD-10-CM | POA: Diagnosis not present

## 2015-10-16 MED ORDER — ALPRAZOLAM 1 MG PO TABS: 1.0000 mg | ORAL_TABLET | Freq: Two times a day (BID) | ORAL | 1 refills | Status: DC

## 2015-10-16 MED ORDER — QUETIAPINE FUMARATE 200 MG PO TABS: 200.0000 mg | ORAL_TABLET | Freq: Every day | ORAL | 0 refills | Status: DC

## 2015-10-16 MED ORDER — BENZTROPINE MESYLATE 0.5 MG PO TABS: 0.5000 mg | ORAL_TABLET | Freq: Every day | ORAL | 1 refills | Status: DC

## 2015-10-16 MED ORDER — GABAPENTIN 300 MG PO CAPS: 300.0000 mg | ORAL_CAPSULE | Freq: Three times a day (TID) | ORAL | 1 refills | Status: DC

## 2015-10-16 NOTE — Progress Notes (Signed)
Patient ID: Zachary Reynolds, male   DOB: 20-Jan-1956, 60 y.o.   MRN: [id number]  [facility] MD/PA/NP OP Progress Note  10/16/2015 2:14 PM Zachary Reynolds  MRN:  [id number]  Subjective:  Patient is a 60 year old male with history of benzodiazepine dependence presented for the follow-up appointment. He reported that he ran out of the Xanax 2 days ago and they called him a prescription of 2 pills yesterday. We calculated his pills and he should not be out of his medications until yesterday. However he does not remember how he ran short of his prescription. He reported that he cannot take venlafaxine and only wants to continue on the benzodiazepine at this time. He appeared somewhat shaky and tremulous during the interview. He reported that he has been taking care of his elderly parents at this time. He has been compliant with his medications. He is getting 15 day supply of the Xanax at this time and has been taking them as prescribed.  He reported that his mother has recently been diagnosed with pancreatic cancer. She is 60 years old.  He reported that he is not taking more than the more prescribed and has refilled his then today. He appeared anxious during the interview. He currently denied having any suicidal homicidal ideations or plans. Patient reported that her medications are helping him and he has started taking the Seroquel at bedtime to help him sleep. He has been sleeping well at night. He currently denied having any mood swings anger anxiety or paranoia. We discussed about the medications in detail and he acknowledged.     Chief Complaint:  Chief Complaint    Follow-up; Medication Refill     Visit Diagnosis:     ICD-9-CM ICD-10-CM   1. Major depressive disorder, recurrent, severe without psychotic features ([place]) 296.33 F33.2   2. Sedative hypnotic withdrawal, uncomplicated (HCC) [id number]    304.10      Past Medical History:  Past Medical History:  Diagnosis Date  . Anxiety   . Arrhythmia    . Bipolar depression ([place])   . BPH with obstruction/lower urinary tract symptoms   . Cancer of right kidney ([place])   . Chronic back pain   . Constipation   . ED (erectile dysfunction)   . Elevated serum creatinine   . Hematuria    gross  . Hepatitis   . Hypothyroidism   . Malaise   . Nephrolithiasis   . Renal disorder   . Slow urinary stream   . Testicular/scrotal pain   . Weak urinary stream     Past Surgical History:  Procedure Laterality Date  . HERNIA REPAIR Bilateral   . NEPHRECTOMY Right   . TONSILLECTOMY     Family History:  Family History  Problem Relation Age of Onset  . Prostate cancer Brother   . Thyroid disease Brother   . Stroke Mother   . Hypertension Mother   . Thyroid disease Mother   . Macular degeneration Father   . Thyroid disease Sister   . Thyroid disease Sister    Social History:  Social History   Social History  . Marital status: Single    Spouse name: N/A  . Number of children: N/A  . Years of education: N/A   Social History Main Topics  . Smoking status: Current Every Day Smoker    Types: Cigarettes    Start date: 12/26/1974  . Smokeless tobacco: Never Used  . Alcohol use No  . Drug use: No  .  Sexual activity: Not Currently   Other Topics Concern  . None   Social History Narrative  . None   Additional History:   Assessment:   Musculoskeletal: Strength & Muscle Tone: within normal limits Gait & Station: normal Patient leans: N/A  Psychiatric Specialty Exam: Anxiety  Symptoms include insomnia and nervous/anxious behavior.    Insomnia  PMH includes: depression (as has largely remained at baseline and not worsened).  Depression         Associated symptoms include insomnia.  Past medical history includes anxiety.   Medication Refill  Associated symptoms include neck pain.    Review of Systems  Constitutional: Negative.   Eyes: Negative.   Respiratory: Negative.   Cardiovascular: Negative.   Gastrointestinal:  Negative.   Genitourinary: Negative.   Musculoskeletal: Positive for back pain and neck pain.  Skin: Negative.   Neurological: Positive for tremors.  Endo/Heme/Allergies: Negative.   Psychiatric/Behavioral: Positive for depression (as has largely remained at baseline and not worsened) and substance abuse. The patient is nervous/anxious and has insomnia.   All other systems reviewed and are negative.   Blood pressure 120/74, pulse 79, temperature 98.7 F (37.1 C), temperature source Tympanic, height 6\' 2"  (1.88 m), weight 220 lb 3.2 oz (99.9 kg).Body mass index is 28.27 kg/m.  General Appearance: Well Groomed  Eye Contact:  Good  Speech:  Slightly slow rate  Volume:  Normal  Mood:  Anxious and Dysphoric  Affect:  Constricted  Thought Process:  Logical  Orientation:  Full (Time, Place, and Person)  Thought Content:  Negative  Suicidal Thoughts:  No  Homicidal Thoughts:  No  Memory:  Immediate;   Good Recent;   Good Remote;   Good  Judgement:  Good  Insight:  Good  Psychomotor Activity:  Negative  Concentration:  Good  Recall:  Good  Fund of Knowledge: Good  Language: Good  Akathisia:  Negative  Handed:    AIMS (if indicated):  None    Assets:  Communication Skills Desire for Improvement Housing Social Support  ADL's:  Intact  Cognition: WNL  Sleep:  fair   Is the patient at risk to self?  No. Has the patient been a risk to self in the past 6 months?  No. Has the patient been a risk to self within the distant past?  No. Is the patient a risk to others?  No. Has the patient been a risk to others in the past 6 months?  No. Has the patient been a risk to others within the distant past?  No.  Current Medications: Current Outpatient Prescriptions  Medication Sig Dispense Refill  . ALPRAZolam (XANAX) 1 MG tablet Take 1 tablet (1 mg total) by mouth 2 (two) times daily. 30 tablet 1  . benztropine (COGENTIN) 0.5 MG tablet Take 1 tablet (0.5 mg total) by mouth daily. 30  tablet 1  . etodolac (LODINE) 400 MG tablet Take 1 tablet (400 mg total) by mouth 2 (two) times daily. 20 tablet 0  . fenofibrate 160 MG tablet Take 160 mg by mouth daily.     Marland Kitchen gabapentin (NEURONTIN) 300 MG capsule Take 1 capsule (300 mg total) by mouth 3 (three) times daily. 90 capsule 1  . levothyroxine (SYNTHROID, LEVOTHROID) 88 MCG tablet Take 88 mcg by mouth daily before breakfast.     . metoprolol succinate (TOPROL-XL) 50 MG 24 hr tablet Take 50 mg by mouth daily.     Marland Kitchen oxyCODONE (ROXICODONE) 15 MG immediate release tablet Take 15  mg by mouth every 6 (six) hours as needed.     Marland Kitchen QUEtiapine (SEROQUEL) 200 MG tablet Take 1 tablet (200 mg total) by mouth daily. 30 tablet 0   No current facility-administered medications for this visit.     Medical Decision Making:  Established Problem, Stable/Improving (1), New Problem, with no additional work-up planned (3), Review of Medication Regimen & Side Effects (2) and Review of New Medication or Change in Dosage (2)  Treatment Plan Summary:Medication management and Plan   Discussed with patient about his medications Continue Seroquel 200 mg at bedtime. I will start him on Effexor XR 75 mg in the morning for his anxiety and he agreed with the plan. We will also start therapy with Miguel Dibble.    More than 50% of the time spent in psychoeducation, counseling and coordination of care.    This note was generated in part or whole with voice recognition software. Voice regonition is usually quite accurate but there are transcription errors that can and very often do occur. I apologize for any typographical errors that were not detected and corrected.   Rainey Pines, MD  10/16/2015, 2:14 PM

## 2015-11-09 ENCOUNTER — Ambulatory Visit (INDEPENDENT_AMBULATORY_CARE_PROVIDER_SITE_OTHER): Payer: 59 | Admitting: Psychiatry

## 2015-11-09 ENCOUNTER — Encounter: Payer: Self-pay | Admitting: Psychiatry

## 2015-11-09 VITALS — BP 135/89 | HR 83 | Temp 98.5°F | Ht 74.0 in | Wt 220.4 lb

## 2015-11-09 DIAGNOSIS — F332 Major depressive disorder, recurrent severe without psychotic features: Secondary | ICD-10-CM | POA: Diagnosis not present

## 2015-11-09 DIAGNOSIS — F1394 Sedative, hypnotic or anxiolytic use, unspecified with sedative, hypnotic or anxiolytic-induced mood disorder: Secondary | ICD-10-CM

## 2015-11-09 MED ORDER — ALPRAZOLAM 1 MG PO TABS: 1.0000 mg | ORAL_TABLET | Freq: Two times a day (BID) | ORAL | 1 refills | Status: DC

## 2015-11-09 MED ORDER — QUETIAPINE FUMARATE 200 MG PO TABS: 200.0000 mg | ORAL_TABLET | Freq: Every day | ORAL | 0 refills | Status: DC

## 2015-11-09 MED ORDER — GABAPENTIN 300 MG PO CAPS: 300.0000 mg | ORAL_CAPSULE | Freq: Three times a day (TID) | ORAL | 1 refills | Status: DC

## 2015-11-09 MED ORDER — GABAPENTIN 300 MG PO CAPS: 300.0000 mg | ORAL_CAPSULE | Freq: Three times a day (TID) | ORAL | 1 refills | Status: AC

## 2015-11-09 NOTE — Progress Notes (Signed)
Patient ID: Zachary Reynolds, male   DOB: 1955/09/08, 60 y.o.   MRN: [id number]  [facility] MD/PA/NP OP Progress Note  11/09/2015 11:35 AM [facility] PENDERS  MRN:  [id number]  Subjective:  Patient is a 60 year old male with history of benzodiazepine dependence presented for the follow-up appointment. He reported that he does not want to take the Effexor or the benztropine as it is making his anxiety worse. He has been compliant with other medications. He appeared calm during the interview. He reported that he has been taking them as prescribed. He currently denied having any withdrawal symptoms of the medications. He reported that he is helping his parents and has been keeping an eye on that. He stated that the medications are helping him at this time. [facility]  as [facility] do not cost him any money.  Patient currently denied using any drugs or alcohol at this time.       Chief Complaint:   Visit Diagnosis:     ICD-9-CM ICD-10-CM   1. Major depressive disorder, recurrent, severe without psychotic features ([place]) 296.33 F33.2   2. Sedative hypnotic withdrawal, uncomplicated (HCC) [id number]    304.10      Past Medical History:  Past Medical History:  Diagnosis Date  . Anxiety   . Arrhythmia   . Bipolar depression ([place])   . BPH with obstruction/lower urinary tract symptoms   . Cancer of right kidney ([place])   . Chronic back pain   . Constipation   . ED (erectile dysfunction)   . Elevated serum creatinine   . Hematuria    gross  . Hepatitis   . Hypothyroidism   . Malaise   . Nephrolithiasis   . Renal disorder   . Slow urinary stream   . Testicular/scrotal pain   . Weak urinary stream     Past Surgical History:  Procedure Laterality Date  . HERNIA REPAIR Bilateral   . NEPHRECTOMY Right   . TONSILLECTOMY     Family History:  Family History  Problem Relation Age of Onset  . Prostate cancer Brother   . Thyroid disease Brother   . Stroke Mother    . Hypertension Mother   . Thyroid disease Mother   . Macular degeneration Father   . Thyroid disease Sister   . Thyroid disease Sister    Social History:  Social History   Social History  . Marital status: Single    Spouse name: N/A  . Number of children: N/A  . Years of education: N/A   Social History Main Topics  . Smoking status: Current Every Day Smoker    Types: Cigarettes    Start date: 12/26/1974  . Smokeless tobacco: Never Used  . Alcohol use No  . Drug use: No  . Sexual activity: Not Currently   Other Topics Concern  . Not on file   Social History Narrative  . No narrative on file   Additional History:   Assessment:   Musculoskeletal: Strength & Muscle Tone: within normal limits Gait & Station: normal Patient leans: N/A  Psychiatric Specialty Exam: Medication Refill  Associated symptoms include neck pain.  Anxiety  Symptoms include insomnia and nervous/anxious behavior.    Insomnia  PMH includes: depression (as has largely remained at baseline and not worsened).  Depression         Associated symptoms include insomnia.  Past medical history includes anxiety.     Review of Systems  Constitutional: Negative.   Eyes: Negative.   Respiratory: Negative.   Cardiovascular: Negative.   Gastrointestinal: Negative.   Genitourinary: Negative.   Musculoskeletal: Positive for back pain and neck pain.  Skin: Negative.   Neurological: Positive for tremors.  Endo/Heme/Allergies: Negative.   Psychiatric/Behavioral: Positive for depression (as has largely remained at baseline and not worsened) and substance abuse. The patient is nervous/anxious and has insomnia.   All other systems reviewed and are negative.   There were no vitals taken for this visit.There is no height or weight on file to calculate BMI.  General Appearance: Well Groomed  Eye Contact:  Good  Speech:  Slightly slow rate  Volume:  Normal  Mood:  Anxious and Dysphoric  Affect:  Constricted   Thought Process:  Logical  Orientation:  Full (Time, Place, and Person)  Thought Content:  Negative  Suicidal Thoughts:  No  Homicidal Thoughts:  No  Memory:  Immediate;   Good Recent;   Good Remote;   Good  Judgement:  Good  Insight:  Good  Psychomotor Activity:  Negative  Concentration:  Good  Recall:  Good  Fund of Knowledge: Good  Language: Good  Akathisia:  Negative  Handed:    AIMS (if indicated):  None    Assets:  Communication Skills Desire for Improvement Housing Social Support  ADL's:  Intact  Cognition: WNL  Sleep:  fair   Is the patient at risk to self?  No. Has the patient been a risk to self in the past 6 months?  No. Has the patient been a risk to self within the distant past?  No. Is the patient a risk to others?  No. Has the patient been a risk to others in the past 6 months?  No. Has the patient been a risk to others within the distant past?  No.  Current Medications: Current Outpatient Prescriptions  Medication Sig Dispense Refill  . ALPRAZolam (XANAX) 1 MG tablet Take 1 tablet (1 mg total) by mouth 2 (two) times daily. 30 tablet 1  . benztropine (COGENTIN) 0.5 MG tablet Take 1 tablet (0.5 mg total) by mouth daily. 30 tablet 1  . etodolac (LODINE) 400 MG tablet Take 1 tablet (400 mg total) by mouth 2 (two) times daily. 20 tablet 0  . fenofibrate 160 MG tablet Take 160 mg by mouth daily.     Marland Kitchen gabapentin (NEURONTIN) 300 MG capsule Take 1 capsule (300 mg total) by mouth 3 (three) times daily. 90 capsule 1  . levothyroxine (SYNTHROID, LEVOTHROID) 88 MCG tablet Take 88 mcg by mouth daily before breakfast.     . metoprolol succinate (TOPROL-XL) 50 MG 24 hr tablet Take 50 mg by mouth daily.     Marland Kitchen oxyCODONE (ROXICODONE) 15 MG immediate release tablet Take 15 mg by mouth every 6 (six) hours as needed.     Marland Kitchen QUEtiapine (SEROQUEL) 200 MG tablet Take 1 tablet (200 mg total) by mouth daily. 30 tablet 0   No current facility-administered medications for this  visit.     Medical Decision Making:  Established Problem, Stable/Improving (1), New Problem, with no additional work-up planned (3), Review of Medication Regimen & Side Effects (2) and Review of New Medication or Change in Dosage (2)  Treatment Plan Summary:Medication management and Plan   Discussed with patient about his medications Continue Seroquel 200 mg at bedtime. Discontinue Effexor and benztropine. He will continue on Xanax 1 mg by mouth twice a day Discussed about side effects of medication in  detail and he demonstrated understanding.    More than 50% of the time spent in psychoeducation, counseling and coordination of care.    This note was generated in part or whole with voice recognition software. Voice regonition is usually quite accurate but there are transcription errors that can and very often do occur. I apologize for any typographical errors that were not detected and corrected.   Rainey Pines, MD  11/09/2015, 11:35 AM

## 2015-12-07 ENCOUNTER — Other Ambulatory Visit: Payer: Self-pay | Admitting: Psychiatry

## 2015-12-26 ENCOUNTER — Other Ambulatory Visit: Payer: Self-pay | Admitting: Psychiatry

## 2016-01-09 ENCOUNTER — Encounter: Payer: Self-pay | Admitting: Psychiatry

## 2016-01-09 ENCOUNTER — Ambulatory Visit (INDEPENDENT_AMBULATORY_CARE_PROVIDER_SITE_OTHER): Payer: 59 | Admitting: Psychiatry

## 2016-01-09 VITALS — BP 164/95 | HR 79 | Wt 223.0 lb

## 2016-01-09 DIAGNOSIS — F1394 Sedative, hypnotic or anxiolytic use, unspecified with sedative, hypnotic or anxiolytic-induced mood disorder: Secondary | ICD-10-CM | POA: Diagnosis not present

## 2016-01-09 DIAGNOSIS — F39 Unspecified mood [affective] disorder: Secondary | ICD-10-CM | POA: Diagnosis not present

## 2016-01-09 MED ORDER — ALPRAZOLAM 1 MG PO TABS: 1.0000 mg | ORAL_TABLET | Freq: Two times a day (BID) | ORAL | 2 refills | Status: AC

## 2016-01-09 MED ORDER — GABAPENTIN 300 MG PO CAPS: 300.0000 mg | ORAL_CAPSULE | Freq: Three times a day (TID) | ORAL | 1 refills | Status: AC

## 2016-01-09 MED ORDER — QUETIAPINE FUMARATE 200 MG PO TABS: 200.0000 mg | ORAL_TABLET | Freq: Every day | ORAL | 1 refills | Status: AC

## 2016-01-09 NOTE — Progress Notes (Signed)
Patient ID: Zachary Reynolds, male   DOB: 1955-11-27, 60 y.o.   MRN: [id number]  [facility] MD/PA/NP OP Progress Note  01/09/2016 2:33 PM Zachary Reynolds  MRN:  [id number]  Subjective:  Patient is a 60 year old male with history of benzodiazepine dependence presented for the follow-up appointment. He continues to be mildly tremulous during the interview. He reported that he has been getting pain medications from the pain clinic in [place]. He reported that he has been having tremors in his right hand due to  the combination of medication. He does not want to change medications at this time. He appeared calm during the interview.  He has been compliant with other medications.He reported that he has been taking them as prescribed. He currently denied having any withdrawal symptoms of the medications. He reported that he is helping his parents and has been keeping an eye on that. He stated that the medications are helping him at this time. He wants his medications to be sent to the [facility]  as they do not cost him any money.  Patient currently denied using any drugs or alcohol at this time.       Chief Complaint:  Chief Complaint    Follow-up; Medication Refill     Visit Diagnosis:   No diagnosis found.  Past Medical History:  Past Medical History:  Diagnosis Date  . Anxiety   . Arrhythmia   . Bipolar depression ([place])   . BPH with obstruction/lower urinary tract symptoms   . Cancer of right kidney ([place])   . Chronic back pain   . Constipation   . ED (erectile dysfunction)   . Elevated serum creatinine   . Hematuria    gross  . Hepatitis   . Hypothyroidism   . Malaise   . Nephrolithiasis   . Renal disorder   . Slow urinary stream   . Testicular/scrotal pain   . Weak urinary stream     Past Surgical History:  Procedure Laterality Date  . HERNIA REPAIR Bilateral   . NEPHRECTOMY Right   . TONSILLECTOMY     Family History:  Family History  Problem Relation Age of Onset  . Prostate  cancer Brother   . Thyroid disease Brother   . Stroke Mother   . Hypertension Mother   . Thyroid disease Mother   . Macular degeneration Father   . Thyroid disease Sister   . Thyroid disease Sister    Social History:  Social History   Social History  . Marital status: Single    Spouse name: N/A  . Number of children: N/A  . Years of education: N/A   Social History Main Topics  . Smoking status: Current Every Day Smoker    Types: Cigarettes    Start date: 12/26/1974  . Smokeless tobacco: Never Used  . Alcohol use No  . Drug use: No  . Sexual activity: Not Currently   Other Topics Concern  . None   Social History Narrative  . None   Additional History:   Assessment:   Musculoskeletal: Strength & Muscle Tone: within normal limits Gait & Station: normal Patient leans: N/A  Psychiatric Specialty Exam: Medication Refill  Associated symptoms include neck pain.  Anxiety  Symptoms include insomnia and nervous/anxious behavior.    Insomnia  PMH includes: depression (as has largely remained at baseline and not worsened).  Depression         Associated symptoms include insomnia.  Past medical history includes anxiety.  Review of Systems  Constitutional: Negative.   Eyes: Negative.   Respiratory: Negative.   Cardiovascular: Negative.   Gastrointestinal: Negative.   Genitourinary: Negative.   Musculoskeletal: Positive for back pain and neck pain.  Skin: Negative.   Neurological: Positive for tremors.  Endo/Heme/Allergies: Negative.   Psychiatric/Behavioral: Positive for depression (as has largely remained at baseline and not worsened) and substance abuse. The patient is nervous/anxious and has insomnia.   All other systems reviewed and are negative.   Blood pressure (!) 164/95, pulse 79, weight 223 lb (101.2 kg).Body mass index is 28.63 kg/m.  General Appearance: Well Groomed  Eye Contact:  Good  Speech:  Slightly slow rate  Volume:  Normal  Mood:   Anxious and Dysphoric  Affect:  Constricted  Thought Process:  Logical  Orientation:  Full (Time, Place, and Person)  Thought Content:  Negative  Suicidal Thoughts:  No  Homicidal Thoughts:  No  Memory:  Immediate;   Good Recent;   Good Remote;   Good  Judgement:  Good  Insight:  Good  Psychomotor Activity:  Negative  Concentration:  Good  Recall:  Good  Fund of Knowledge: Good  Language: Good  Akathisia:  Negative  Handed:    AIMS (if indicated):  None    Assets:  Communication Skills Desire for Improvement Housing Social Support  ADL's:  Intact  Cognition: WNL  Sleep:  fair   Is the patient at risk to self?  No. Has the patient been a risk to self in the past 6 months?  No. Has the patient been a risk to self within the distant past?  No. Is the patient a risk to others?  No. Has the patient been a risk to others in the past 6 months?  No. Has the patient been a risk to others within the distant past?  No.  Current Medications: Current Outpatient Prescriptions  Medication Sig Dispense Refill  . ALPRAZolam (XANAX) 1 MG tablet Take 1 tablet (1 mg total) by mouth 2 (two) times daily. 60 tablet 1  . etodolac (LODINE) 400 MG tablet Take 1 tablet (400 mg total) by mouth 2 (two) times daily. 20 tablet 0  . fenofibrate 160 MG tablet Take 160 mg by mouth daily.     Marland Kitchen gabapentin (NEURONTIN) 300 MG capsule Take 1 capsule (300 mg total) by mouth 3 (three) times daily. 270 capsule 1  . gabapentin (NEURONTIN) 300 MG capsule Take 1 capsule (300 mg total) by mouth 3 (three) times daily. 90 capsule 1  . levothyroxine (SYNTHROID, LEVOTHROID) 88 MCG tablet Take 88 mcg by mouth daily before breakfast.     . metoprolol succinate (TOPROL-XL) 50 MG 24 hr tablet Take 50 mg by mouth daily.     Marland Kitchen oxyCODONE (ROXICODONE) 15 MG immediate release tablet Take 15 mg by mouth every 6 (six) hours as needed.     Marland Kitchen QUEtiapine (SEROQUEL) 200 MG tablet Take 1 tablet (200 mg total) by mouth daily. 90 tablet  0   No current facility-administered medications for this visit.     Medical Decision Making:  Established Problem, Stable/Improving (1), New Problem, with no additional work-up planned (3), Review of Medication Regimen & Side Effects (2) and Review of New Medication or Change in Dosage (2)  Treatment Plan Summary:Medication management and Plan   Discussed with patient about his medications Continue Seroquel 200 mg at bedtime.Patient will try to take half pill at this time. He will continue on Xanax 1 mg by mouth  twice a day Continue gabapentin as prescribed Discussed about side effects of medication in detail and he demonstrated understanding.  Follow-up in 3 months or earlier     More than 50% of the time spent in psychoeducation, counseling and coordination of care.    This note was generated in part or whole with voice recognition software. Voice regonition is usually quite accurate but there are transcription errors that can and very often do occur. I apologize for any typographical errors that were not detected and corrected.   Rainey Pines, MD  01/09/2016, 2:33 PM

## 2016-04-03 ENCOUNTER — Telehealth: Payer: Self-pay | Admitting: Psychiatry

## 2016-04-03 NOTE — Telephone Encounter (Signed)
called in ok for refill on gabapentin 300mg  , quetiapine 200mg  and xanax

## 2016-04-07 ENCOUNTER — Ambulatory Visit: Payer: Medicare Other | Admitting: Psychiatry

## 2016-04-08 NOTE — Telephone Encounter (Signed)
called in ok for refill on xanax with no additional refills

## 2016-04-10 ENCOUNTER — Emergency Department
Admission: EM | Admit: 2016-04-10 | Discharge: 2016-04-11 | Disposition: A | Discharge status: Home / Self Care | Payer: Medicare Other | Attending: Emergency Medicine | Admitting: Emergency Medicine

## 2016-04-10 ENCOUNTER — Encounter: Payer: Self-pay | Admitting: Emergency Medicine

## 2016-04-10 DIAGNOSIS — G8929 Other chronic pain: Secondary | ICD-10-CM | POA: Diagnosis not present

## 2016-04-10 DIAGNOSIS — F4325 Adjustment disorder with mixed disturbance of emotions and conduct: Secondary | ICD-10-CM | POA: Diagnosis not present

## 2016-04-10 DIAGNOSIS — F341 Dysthymic disorder: Secondary | ICD-10-CM | POA: Insufficient documentation

## 2016-04-10 DIAGNOSIS — Z85528 Personal history of other malignant neoplasm of kidney: Secondary | ICD-10-CM | POA: Diagnosis not present

## 2016-04-10 DIAGNOSIS — E039 Hypothyroidism, unspecified: Secondary | ICD-10-CM | POA: Insufficient documentation

## 2016-04-10 DIAGNOSIS — M549 Dorsalgia, unspecified: Secondary | ICD-10-CM | POA: Diagnosis present

## 2016-04-10 DIAGNOSIS — F17210 Nicotine dependence, cigarettes, uncomplicated: Secondary | ICD-10-CM | POA: Diagnosis not present

## 2016-04-10 DIAGNOSIS — Z79899 Other long term (current) drug therapy: Secondary | ICD-10-CM | POA: Diagnosis not present

## 2016-04-10 LAB — COMPREHENSIVE METABOLIC PANEL
ALBUMIN: 4.8 g/dL (ref 3.5–5.0)
ALK PHOS: 57 U/L (ref 38–126)
ALT: 29 U/L (ref 17–63)
AST: 32 U/L (ref 15–41)
Anion gap: 11 (ref 5–15)
BUN: 19 mg/dL (ref 6–20)
CHLORIDE: 105 mmol/L (ref 101–111)
CO2: 23 mmol/L (ref 22–32)
CREATININE: 1.3 mg/dL — AB (ref 0.61–1.24)
Calcium: 9.8 mg/dL (ref 8.9–10.3)
GFR calc non Af Amer: 58 mL/min — ABNORMAL LOW (ref >60)
GLUCOSE: 126 mg/dL — AB (ref 65–99)
Potassium: 4.1 mmol/L (ref 3.5–5.1)
SODIUM: 139 mmol/L (ref 135–145)
Total Bilirubin: 1.2 mg/dL (ref 0.3–1.2)
Total Protein: 8.1 g/dL (ref 6.5–8.1)

## 2016-04-10 LAB — URINE DRUG SCREEN, QUALITATIVE (ARMC ONLY)
AMPHETAMINES, UR SCREEN: NOT DETECTED
BENZODIAZEPINE, UR SCRN: POSITIVE — AB
Barbiturates, Ur Screen: NOT DETECTED
Cannabinoid 50 Ng, Ur ~~LOC~~: NOT DETECTED
Cocaine Metabolite,Ur ~~LOC~~: NOT DETECTED
MDMA (Ecstasy)Ur Screen: POSITIVE — AB
METHADONE SCREEN, URINE: NOT DETECTED
Opiate, Ur Screen: POSITIVE — AB
Phencyclidine (PCP) Ur S: NOT DETECTED
Tricyclic, Ur Screen: POSITIVE — AB

## 2016-04-10 LAB — CBC
HCT: 49.7 % (ref 40.0–52.0)
Hemoglobin: 17.2 g/dL (ref 13.0–18.0)
MCH: 29.3 pg (ref 26.0–34.0)
MCHC: 34.5 g/dL (ref 32.0–36.0)
MCV: 85 fL (ref 80.0–100.0)
PLATELETS: 266 10*3/uL (ref 150–440)
RBC: 5.85 MIL/uL (ref 4.40–5.90)
RDW: 14.4 % (ref 11.5–14.5)
WBC: 13.1 10*3/uL — AB (ref 3.8–10.6)

## 2016-04-10 LAB — ACETAMINOPHEN LEVEL

## 2016-04-10 LAB — ETHANOL: Alcohol, Ethyl (B): <5 mg/dL (ref <5)

## 2016-04-10 LAB — SALICYLATE LEVEL

## 2016-04-10 MED ORDER — OXYCODONE-ACETAMINOPHEN 5-325 MG PO TABS
2.0000 | ORAL_TABLET | Freq: Four times a day (QID) | ORAL | Status: DC | PRN
  Administered 2016-04-10 – 2016-04-11 (×3): 2 via ORAL
  Filled 2016-04-10 (×3): qty 2

## 2016-04-10 MED ORDER — THIAMINE HCL 100 MG/ML IJ SOLN
100.0000 mg | Freq: Once | INTRAMUSCULAR | Status: AC
  Administered 2016-04-10: 100 mg via INTRAMUSCULAR
  Filled 2016-04-10: qty 2

## 2016-04-10 MED ORDER — LORAZEPAM 1 MG PO TABS
1.0000 mg | ORAL_TABLET | Freq: Four times a day (QID) | ORAL | Status: DC | PRN
  Administered 2016-04-10: 1 mg via ORAL
  Filled 2016-04-10: qty 1

## 2016-04-10 MED ORDER — ADULT MULTIVITAMIN W/MINERALS CH
1.0000 | ORAL_TABLET | Freq: Every day | ORAL | Status: DC
  Administered 2016-04-10 – 2016-04-11 (×2): 1 via ORAL
  Filled 2016-04-10 (×2): qty 1

## 2016-04-10 MED ORDER — GABAPENTIN 600 MG PO TABS
300.0000 mg | ORAL_TABLET | Freq: Three times a day (TID) | ORAL | Status: DC | PRN
  Administered 2016-04-10 – 2016-04-11 (×2): 300 mg via ORAL
  Filled 2016-04-10 (×2): qty 1

## 2016-04-10 MED ORDER — METOPROLOL SUCCINATE ER 50 MG PO TB24
50.0000 mg | ORAL_TABLET | Freq: Every day | ORAL | Status: DC
  Administered 2016-04-10 – 2016-04-11 (×2): 50 mg via ORAL
  Filled 2016-04-10 (×2): qty 1

## 2016-04-10 MED ORDER — HYDROXYZINE HCL 25 MG PO TABS
25.0000 mg | ORAL_TABLET | Freq: Four times a day (QID) | ORAL | Status: DC | PRN
  Administered 2016-04-10 – 2016-04-11 (×2): 25 mg via ORAL
  Filled 2016-04-10 (×2): qty 1

## 2016-04-10 MED ORDER — DULOXETINE HCL 60 MG PO CPEP
60.0000 mg | ORAL_CAPSULE | Freq: Every day | ORAL | Status: DC
  Administered 2016-04-10: 60 mg via ORAL
  Filled 2016-04-10 (×2): qty 1

## 2016-04-10 MED ORDER — VITAMIN B-1 100 MG PO TABS
100.0000 mg | ORAL_TABLET | Freq: Every day | ORAL | Status: DC
  Administered 2016-04-11: 100 mg via ORAL
  Filled 2016-04-10: qty 1

## 2016-04-10 MED ORDER — LEVOTHYROXINE SODIUM 88 MCG PO TABS
88.0000 ug | ORAL_TABLET | Freq: Every day | ORAL | Status: DC
  Administered 2016-04-11: 88 ug via ORAL
  Filled 2016-04-10: qty 1

## 2016-04-10 MED ORDER — ONDANSETRON 8 MG PO TBDP
4.0000 mg | ORAL_TABLET | Freq: Four times a day (QID) | ORAL | Status: DC | PRN
  Filled 2016-04-10: qty 0.5

## 2016-04-10 MED ORDER — ALPRAZOLAM 0.5 MG PO TABS
0.5000 mg | ORAL_TABLET | Freq: Once | ORAL | Status: AC
  Administered 2016-04-10: 0.5 mg via ORAL
  Filled 2016-04-10: qty 1

## 2016-04-10 NOTE — ED Notes (Signed)
SOC  REPORT GIVEN  TO  DR  Jacqualine Code

## 2016-04-10 NOTE — ED Notes (Signed)
Pt [facility] in [facility] for [facility]

## 2016-04-10 NOTE — ED Notes (Signed)
SOC complete. SOC called and stated that pt needs inpatient care as he is on "wrong medications" and has access to a firearm at home. He to fax report and keep case open for EDP to call him with any questions for 30 minutes.

## 2016-04-10 NOTE — ED Notes (Signed)
Pt c/o anxiety and depression given prn as ordered.

## 2016-04-10 NOTE — ED Notes (Signed)
Pt. Alert and oriented, warm and dry, in no distress. Pt. Denies HI, and AVH. Pt reports having passive SI with no plan. Pt able to contract for safety with this Probation officer. Pt states, "I take pain medication for my back that I get every week from pain clinic. Last week something worse happened to my back and the disk has slipped and is pressing on my nerves. My feet have a burning sensation.   Pt. Encouraged to let nursing staff know of any concerns or needs.

## 2016-04-10 NOTE — ED Notes (Signed)

## 2016-04-10 NOTE — ED Notes (Signed)
PT IVC/ PENDING PLACEMENT  

## 2016-04-10 NOTE — ED Notes (Signed)
IVC  SOC  PAPERWORK  ON CHART  PENDING  PLACEMENT

## 2016-04-10 NOTE — ED Notes (Signed)
Gave report to soc

## 2016-04-10 NOTE — ED Provider Notes (Signed)
[facility] Emergency Department Provider Note   ____________________________________________   None    (approximate)  I have reviewed the triage vital signs and the nursing notes.   HISTORY  Chief Complaint Back Pain and Suicidal    HPI Zachary Reynolds is a 61 y.o. male reports that he feels suicidal, and very "depressed" with his life because of the chronic pain he's had in his back. He tells me that last night he ran out of his weekly oxycodone prescription, this morning the pain has not been controllable and he feels a shooting pain into the toes of both feet that so bad it makes him want to "curl his toes".  He reports a stinging burning sensation in the feet and upper back. Reports no significant change, and that he has had chronic pain in his back like this for a very long time. He does report however that he ran out of his pain medication last night, and reports he doesn't think he can live without pain medicine and this is making him feel suicidal.  He also reports that he uses Xanax, he reports that he ran out of that about a week ago as well, and describes that things are "a bad intersection" where he can't get his Xanax or oxycodone.  Does report having access to gun, does report active suicidal thoughts.  No changes in bowel or bladder habits. No loss of bowels. He is able to walk, but reports pain in his feet. Patient denies any acute or new changes regarding his back pain. No fevers.   Past Medical History:  Diagnosis Date  . Anxiety   . Arrhythmia   . Bipolar depression ([place])   . BPH with obstruction/lower urinary tract symptoms   . Cancer of right kidney ([place])   . Chronic back pain   . Constipation   . ED (erectile dysfunction)   . Elevated serum creatinine   . Hematuria    gross  . Hepatitis   . Hypothyroidism   . Malaise   . Nephrolithiasis   . Renal disorder   . Slow urinary stream   . Testicular/scrotal pain   . Weak  urinary stream     Patient Active Problem List   Diagnosis Date Noted  . 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 Chronic back pain 04/11/2016  . Difficulty sleeping 05/01/2014  . 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 Has a tremor 02/22/2014    Past Surgical History:  Procedure Laterality Date  . HERNIA REPAIR Bilateral   . NEPHRECTOMY Right   . TONSILLECTOMY      Prior to Admission medications   Medication Sig Start Date End Date Taking? Authorizing Provider  ALPRAZolam Duanne Moron) 1 MG tablet Take 1 tablet (1 mg total) by mouth 2 (two) times daily. 01/09/16  Yes Rainey Pines, MD  fenofibrate 160 MG tablet Take 160 mg by mouth daily.  11/15/14  Yes Historical Provider, MD  gabapentin (NEURONTIN) 300 MG capsule Take 1 capsule (300 mg total) by mouth 3 (three) times daily. 11/09/15  Yes Rainey Pines, MD  levothyroxine (SYNTHROID, LEVOTHROID) 88 MCG tablet Take 88 mcg by mouth daily before breakfast.  10/26/14  Yes Historical Provider, MD  metoprolol succinate (TOPROL-XL) 50 MG 24 hr tablet Take 50 mg by mouth daily.  10/12/14  Yes Historical Provider, MD  QUEtiapine (SEROQUEL) 200 MG tablet Take 1 tablet (200 mg total) by mouth daily. 01/09/16  Yes Rainey Pines, MD  etodolac (LODINE) 400 MG tablet Take 1 tablet (400 mg total) by mouth 2 (two) times daily. Patient not taking: Reported on 04/10/2016 09/21/15   Johnn Hai, PA-C  gabapentin (NEURONTIN) 300 MG capsule Take 1 capsule (300 mg total) by mouth 3 (three) times daily. 01/09/16   Rainey Pines, MD  oxyCODONE (ROXICODONE) 15 MG immediate release tablet Take 15 mg by mouth every 6 (six) hours as needed.  01/04/15   Historical Provider, MD    Allergies Amlodipine; Haloperidol; Nucynta [tapentadol]; and Prednisone  Family History  Problem Relation Age of Onset  . Prostate cancer Brother   . Thyroid disease Brother   . Stroke Mother   . Hypertension Mother   . Thyroid disease Mother   . Macular  degeneration Father   . Thyroid disease Sister   . Thyroid disease Sister     Social History Social History  Substance Use Topics  . Smoking status: Current Every Day Smoker    Types: Cigarettes    Start date: 12/26/1974  . Smokeless tobacco: Never Used  . Alcohol use No    Review of Systems Constitutional: No fever/chills Eyes: No visual changes. ENT: No sore throat. Cardiovascular: Denies chest pain. Respiratory: Denies shortness of breath. Gastrointestinal: No abdominal pain.  No nausea, no vomiting.  No diarrhea.  No constipation. Genitourinary: Negative for dysuria. Musculoskeletal: See history of present illness Skin: Negative for rash. Neurological: Negative for headaches, focal weakness or numbness.  10-point ROS otherwise negative.  ____________________________________________   PHYSICAL EXAM:  VITAL SIGNS: ED Triage Vitals  Enc Vitals Group     BP 04/10/16 0835 (!) 170/113     Pulse Rate 04/10/16 0835 78     Resp 04/10/16 0835 20     Temp 04/10/16 0837 98.3 F (36.8 C)     Temp Source 04/10/16 0837 Oral     SpO2 04/10/16 0835 98 %     Weight 04/10/16 0835 220 lb (99.8 kg)     Height 04/10/16 0835 6\' 1"  (1.854 m)     Head Circumference --      Peak Flow --      Pain Score 04/10/16 0835 10     Pain Loc --      Pain Edu? --      Excl. in [place]? --     Constitutional: Alert and oriented. Well appearing and in no acute distress.Sitting on the stretcher resting comfortably. Eyes: Conjunctivae are normal. PERRL. EOMI. Head: Atraumatic. Nose: No congestion/rhinnorhea. Mouth/Throat: Mucous membranes are moist.  Oropharynx non-erythematous. Neck: No stridor.   Cardiovascular: Normal rate, regular rhythm. Grossly normal heart sounds.  Good peripheral circulation. Respiratory: Normal respiratory effort.  No retractions. Lungs CTAB. Gastrointestinal: Soft and nontender. No distention.  Musculoskeletal: No lower extremity tenderness nor edema.  No joint  effusions. Able to wiggle her toes and his feet normally. 5 out of 5 strength in lower extremities bilaterally. Reports a slight decrease in sensation across the feet bilaterally, reports this is chronic. Neurologic:  Normal speech and language. No gross focal neurologic deficits are appreciated.  Skin:  Skin is warm, dry and intact. No rash noted. Psychiatric: Mood and affect are slightly anxious and reports active suicidal thoughts.  ____________________________________________   LABS (all labs ordered are listed, but only abnormal results are displayed)  Labs Reviewed  COMPREHENSIVE METABOLIC PANEL - Abnormal; Notable for the following:       Result Value   Glucose, Bld 126 (*)    Creatinine, Ser 1.30 (*)  GFR calc non Af Amer 58 (*)    All other components within normal limits  ACETAMINOPHEN LEVEL - Abnormal; Notable for the following:    Acetaminophen (Tylenol), Serum <10 (*)    All other components within normal limits  CBC - Abnormal; Notable for the following:    WBC 13.1 (*)    All other components within normal limits  URINE DRUG SCREEN, QUALITATIVE ([facility] ONLY) - Abnormal; Notable for the following:    Tricyclic, Ur Screen POSITIVE (*)    MDMA (Ecstasy)Ur Screen POSITIVE (*)    Opiate, Ur Screen POSITIVE (*)    Benzodiazepine, Ur Scrn POSITIVE (*)    All other components within normal limits  ETHANOL  SALICYLATE LEVEL   ____________________________________________  EKG   ____________________________________________  RADIOLOGY   ____________________________________________   PROCEDURES  Procedure(s) performed: None  Procedures  Critical Care performed: No  ____________________________________________   INITIAL IMPRESSION / ASSESSMENT AND PLAN / ED COURSE  Pertinent labs & imaging results that were available during my care of the patient were reviewed by me and considered in my medical decision making (see chart for details).  Patient reports  suicidal ideation, appears to be driven primarily by his chronic pain and anxiety issues from my impression. He does report to me that a significant driving factor is having run out of his oxycodone and not being about refilled until Friday. He does report a gun, and active suicidal ideation and thus the patient is placed under involuntary commitment and a consultation to psychiatry has been requested.  I discussed with the patient that I would not be able to refill any of his controlled substance refills, including his oxycodone and Xanax.  The patient denies any acute changes in his back pain, appears to be chronic in nature without any new or acute red flags noted.      ____________________________________________   FINAL CLINICAL IMPRESSION(S) / ED DIAGNOSES  Final diagnoses:  Chronic midline back pain, unspecified back location  Dysthymia      NEW MEDICATIONS STARTED DURING THIS VISIT:  Discharge Medication List as of 04/11/2016  1:45 PM       Note:  This document was prepared using Dragon voice recognition software and may include unintentional dictation errors.     Delman Kitten, MD 04/12/16 [phone]

## 2016-04-10 NOTE — ED Notes (Signed)
Pt returned to [facility]

## 2016-04-10 NOTE — ED Notes (Signed)

## 2016-04-10 NOTE — ED Notes (Signed)
Pt ambulated to bathroom with no assistance.  

## 2016-04-10 NOTE — ED Notes (Addendum)
Sandwich and soft drink given.  

## 2016-04-10 NOTE — ED Notes (Signed)
Pt arrived from main ed ,he states I am very sick ,hot and flushed and shaking like crazy from the medicine they gave me " They gave me cymbalta for the first time and it made me sick. He has a tremor which is chronic , he has had back problems with severe pain for years, he is now c/o of depression and suicidal thoughts with a plan of shooting but he does contract for safety. HE is very anxious and tearful ,c/o about horrible mattress and his back.

## 2016-04-10 NOTE — ED Triage Notes (Signed)
Pt c/o chronic back pain that is making him want to kill himself. Pt also admits to other stressors such as not being able to take care of his 61 year old parents. Admits to plan of wanting to shoot himself and has access to gun. Denies HI. Denies hallucinations.

## 2016-04-10 NOTE — ED Notes (Signed)
Called dining services for lunch tray

## 2016-04-10 NOTE — ED Notes (Signed)
Pt given a cup with lid and straw due to his tremors he has spilled his drink x 2

## 2016-04-10 NOTE — ED Notes (Signed)
Pt c/o increased pain and anxiety given meds as ordered and pt ciwa was 10 even though  Order ato give is for 10 I used it as a prn for anxiety and the nuerontin  For pain

## 2016-04-11 DIAGNOSIS — G8929 Other chronic pain: Secondary | ICD-10-CM

## 2016-04-11 DIAGNOSIS — F4325 Adjustment disorder with mixed disturbance of emotions and conduct: Secondary | ICD-10-CM

## 2016-04-11 DIAGNOSIS — M549 Dorsalgia, unspecified: Secondary | ICD-10-CM

## 2016-04-11 NOTE — Consult Note (Signed)
[facility] Face-to-Face Psychiatry Consult   Reason for Consult:  Consult for 61 year old man with a history of chronic anxiety and mood problems who presented to the emergency room with suicidal thoughts Referring Physician:  Darl Householder Patient Identification: DANUEL FELICETTI MRN:  [id number] Principal Diagnosis: Adjustment disorder with mixed disturbance of emotions and conduct Diagnosis:   Patient Active Problem List   Diagnosis Date Noted  . Adjustment disorder with mixed disturbance of emotions and conduct [F43.25] 04/11/2016  . Chronic back pain [M54.9, G89.29] 04/11/2016  . Difficulty sleeping [G47.9] 05/01/2014  . Drug-induced tremor [G25.1] 02/22/2014  . Has a tremor [R25.1] 02/22/2014    Total Time spent with patient: 1 hour  Subjective:   JAYMEN FETCH is a 61 y.o. male patient admitted with "I'm better now".  HPI:  Patient interviewed. Chart reviewed. Labs reviewed. 61 year old man who has a history of chronic anxiety symptoms says that he became distraught in the last few days to week because his back and leg pain has been much worse. He apparently has chronic back pain but recently developed more severe foot pain which he suspects is related to a bulging disc in his back. It had been going on for about a week. He says he was getting very depressed and desperate about it but for some reason did not contact his regular physician or neurosurgeon. He had himself brought to the hospital and made comments about how he felt so bad that he had thoughts about shooting himself. Today the patient tells me he never really meant it and that he was really just trying to express how much pain he was in and how desperate he was for help. Since being in the hospital the patient has been cooperative and has not done anything to try and hurt himself. Affect is slightly constricted but overall stable. Not tearful doesn't appear to be depressed. Not reporting any psychotic symptoms. Denies any alcohol or drug abuse.  Patient says that after being here for over a day he feels like he is much more stable and no longer has any thoughts of hurting himself.  Medical history: Chronic back pain. Managed usually with 15 mg of oxycodone 4 times a day. Interestingly he has continued to take this but thinks it's not helping at all with his current pain. He sees a pain physician and a neurosurgeon from what he tells me. Also has a past history of renal cancer and had one kidney removed but apparently is cured of that.  Substance abuse history: Denies any alcohol or drug abuse and denies any history of abusing any of his medicines. 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  Social history: Patient lives by himself. Not able to work. His parents live across the street from him and are both quite elderly. He feels guilty that his back pain is limiting his ability to take care of them.  Past Psychiatric History: Patient has never had any suicide attempts in the past. He says he has had 2 prior psychiatric hospitalizations both for essentially identical problems. No history of violence. Denies history of psychotic symptoms. He does take Seroquel regularly as well as Xanax. He is currently followed by [name]  Risk to Self: Is patient at risk for suicide?: Yes Risk to Others:   Prior Inpatient Therapy:   Prior Outpatient Therapy:    Past Medical History:  Past Medical History:  Diagnosis Date  . Anxiety   . Arrhythmia   . Bipolar depression ([place])   . BPH with obstruction/lower urinary tract symptoms   . Cancer of right kidney ([place])   . Chronic back pain   . Constipation   . ED (erectile dysfunction)   . Elevated serum creatinine   . Hematuria    gross  . Hepatitis   . Hypothyroidism   . Malaise   . Nephrolithiasis   . Renal disorder   . Slow urinary stream   . Testicular/scrotal pain    . Weak urinary stream     Past Surgical History:  Procedure Laterality Date  . HERNIA REPAIR Bilateral   . NEPHRECTOMY Right   . TONSILLECTOMY     Family History:  Family History  Problem Relation Age of Onset  . Prostate cancer Brother   . Thyroid disease Brother   . Stroke Mother   . Hypertension Mother   . Thyroid disease Mother   . Macular degeneration Father   . Thyroid disease Sister   . Thyroid disease Sister    Family Psychiatric  History: Does not know of any family history of mental illness Social History:  History  Alcohol Use No     History  Drug Use No    Social History   Social History  . Marital status: Single    Spouse name: N/A  . Number of children: N/A  . Years of education: N/A   Social History Main Topics  . Smoking status: Current Every Day Smoker    Types: Cigarettes    Start date: 12/26/1974  . Smokeless tobacco: Never Used  . Alcohol use No  . Drug use: No  . Sexual activity: Not Currently   Other Topics Concern  . None   Social History Narrative  . None   Additional Social History:    Allergies:   Allergies  Allergen Reactions  . Amlodipine     Other reaction(s): Other (See Comments) Hard heart beat, insomnia  . Haloperidol Other (See Comments)  . Nucynta [Tapentadol]   . Prednisone     Labs:  Results for orders placed or performed during the hospital encounter of 04/10/16 (from the past 48 hour(s))  Comprehensive metabolic panel     Status: Abnormal   Collection Time: 04/10/16  8:47 AM  Result Value Ref Range   Sodium 139 135 - 145 mmol/L   Potassium 4.1 3.5 - 5.1 mmol/L   Chloride 105 101 - 111 mmol/L   CO2 23 22 - 32 mmol/L   Glucose, Bld 126 (H) 65 - 99 mg/dL   BUN 19 6 - 20 mg/dL   Creatinine, Ser 1.30 (H) 0.61 - 1.24 mg/dL   Calcium 9.8 8.9 - 10.3 mg/dL   Total Protein 8.1 6.5 - 8.1 g/dL   Albumin 4.8 3.5 - 5.0 g/dL   AST 32 15 - 41 U/L   ALT 29 17 - 63 U/L   Alkaline Phosphatase 57 38 - 126 U/L    Total Bilirubin 1.2 0.3 - 1.2 mg/dL   GFR calc non Af Amer 58 (L) >60 mL/min   GFR calc Af Amer >60 >60 mL/min    Comment: (NOTE) The eGFR has been calculated using the CKD EPI equation. This calculation has not been validated in all clinical situations. eGFR's persistently <60 mL/min signify possible Chronic Kidney Disease.    Anion gap 11 5 - 15  Ethanol     Status: None  Collection Time: 04/10/16  8:47 AM  Result Value Ref Range   Alcohol, Ethyl (B) <5 <5 mg/dL    Comment:        LOWEST DETECTABLE LIMIT FOR SERUM ALCOHOL IS 5 mg/dL FOR MEDICAL PURPOSES ONLY   Salicylate level     Status: None   Collection Time: 04/10/16  8:47 AM  Result Value Ref Range   Salicylate Lvl <8.5 2.8 - 30.0 mg/dL  Acetaminophen level     Status: Abnormal   Collection Time: 04/10/16  8:47 AM  Result Value Ref Range   Acetaminophen (Tylenol), Serum <10 (L) 10 - 30 ug/mL    Comment:        THERAPEUTIC CONCENTRATIONS VARY SIGNIFICANTLY. A RANGE OF 10-30 ug/mL MAY BE AN EFFECTIVE CONCENTRATION FOR MANY PATIENTS. HOWEVER, SOME ARE BEST TREATED AT CONCENTRATIONS OUTSIDE THIS RANGE. ACETAMINOPHEN CONCENTRATIONS >150 ug/mL AT 4 HOURS AFTER INGESTION AND >50 ug/mL AT 12 HOURS AFTER INGESTION ARE OFTEN ASSOCIATED WITH TOXIC REACTIONS.   cbc     Status: Abnormal   Collection Time: 04/10/16  8:47 AM  Result Value Ref Range   WBC 13.1 (H) 3.8 - 10.6 K/uL   RBC 5.85 4.40 - 5.90 MIL/uL   Hemoglobin 17.2 13.0 - 18.0 g/dL   HCT 49.7 40.0 - 52.0 %   MCV 85.0 80.0 - 100.0 fL   MCH 29.3 26.0 - 34.0 pg   MCHC 34.5 32.0 - 36.0 g/dL   RDW 14.4 11.5 - 14.5 %   Platelets 266 150 - 440 K/uL  Urine Drug Screen, Qualitative     Status: Abnormal   Collection Time: 04/10/16  8:47 AM  Result Value Ref Range   Tricyclic, Ur Screen POSITIVE (A) NONE DETECTED   Amphetamines, Ur Screen NONE DETECTED NONE DETECTED   MDMA (Ecstasy)Ur Screen POSITIVE (A) NONE DETECTED   Cocaine Metabolite,Ur [place] NONE DETECTED NONE  DETECTED   Opiate, Ur Screen POSITIVE (A) NONE DETECTED   Phencyclidine (PCP) Ur S NONE DETECTED NONE DETECTED   Cannabinoid 50 Ng, Ur [place] NONE DETECTED NONE DETECTED   Barbiturates, Ur Screen NONE DETECTED NONE DETECTED   Benzodiazepine, Ur Scrn POSITIVE (A) NONE DETECTED   Methadone Scn, Ur NONE DETECTED NONE DETECTED    Comment: (NOTE) 462  Tricyclics, urine               Cutoff 1000 ng/mL 200  Amphetamines, urine             Cutoff 1000 ng/mL 300  MDMA (Ecstasy), urine           Cutoff 500 ng/mL 400  Cocaine Metabolite, urine       Cutoff 300 ng/mL 500  Opiate, urine                   Cutoff 300 ng/mL 600  Phencyclidine (PCP), urine      Cutoff 25 ng/mL 700  Cannabinoid, urine              Cutoff 50 ng/mL 800  Barbiturates, urine             Cutoff 200 ng/mL 900  Benzodiazepine, urine           Cutoff 200 ng/mL 1000 Methadone, urine                Cutoff 300 ng/mL 1100 1200 The urine drug screen provides only a preliminary, unconfirmed 1300 analytical test result and should not be used for non-medical 1400 purposes. Clinical consideration  and professional judgment should 1500 be applied to any positive drug screen result due to possible 1600 interfering substances. A more specific alternate chemical method 1700 must be used in order to obtain a confirmed analytical result.  1800 Gas chromato graphy / mass spectrometry (GC/MS) is the preferred 1900 confirmatory method.     Current Facility-Administered Medications  Medication Dose Route Frequency Provider Last Rate Last Dose  . DULoxetine (CYMBALTA) DR capsule 60 mg  60 mg Oral Daily Delman Kitten, MD   60 mg at 04/10/16 1231  . gabapentin (NEURONTIN) tablet 300 mg  300 mg Oral TID PRN Delman Kitten, MD   300 mg at 04/11/16 0509  . hydrOXYzine (ATARAX/VISTARIL) tablet 25 mg  25 mg Oral Q6H PRN Delman Kitten, MD   25 mg at 04/11/16 1143  . levothyroxine (SYNTHROID, LEVOTHROID) tablet 88 mcg  88 mcg Oral QAC breakfast Delman Kitten, MD   88  mcg at 04/11/16 0631  . LORazepam (ATIVAN) tablet 1 mg  1 mg Oral Q6H PRN Delman Kitten, MD   1 mg at 04/10/16 1726  . metoprolol succinate (TOPROL-XL) 24 hr tablet 50 mg  50 mg Oral Daily Delman Kitten, MD   50 mg at 04/11/16 0952  . multivitamin with minerals tablet 1 tablet  1 tablet Oral Daily Delman Kitten, MD   1 tablet at 04/11/16 [phone]  . ondansetron (ZOFRAN-ODT) disintegrating tablet 4 mg  4 mg Oral Q6H PRN Delman Kitten, MD      . oxyCODONE-acetaminophen (PERCOCET/ROXICET) 5-325 MG per tablet 2 tablet  2 tablet Oral Q6H PRN Nena Polio, MD   2 tablet at 04/11/16 1116  . thiamine (VITAMIN B-1) tablet 100 mg  100 mg Oral Daily Delman Kitten, MD   100 mg at 04/11/16 4132   Current Outpatient Prescriptions  Medication Sig Dispense Refill  . ALPRAZolam (XANAX) 1 MG tablet Take 1 tablet (1 mg total) by mouth 2 (two) times daily. 60 tablet 2  . fenofibrate 160 MG tablet Take 160 mg by mouth daily.     Marland Kitchen gabapentin (NEURONTIN) 300 MG capsule Take 1 capsule (300 mg total) by mouth 3 (three) times daily. 90 capsule 1  . levothyroxine (SYNTHROID, LEVOTHROID) 88 MCG tablet Take 88 mcg by mouth daily before breakfast.     . metoprolol succinate (TOPROL-XL) 50 MG 24 hr tablet Take 50 mg by mouth daily.     . QUEtiapine (SEROQUEL) 200 MG tablet Take 1 tablet (200 mg total) by mouth daily. 90 tablet 1  . etodolac (LODINE) 400 MG tablet Take 1 tablet (400 mg total) by mouth 2 (two) times daily. (Patient not taking: Reported on 04/10/2016) 20 tablet 0  . gabapentin (NEURONTIN) 300 MG capsule Take 1 capsule (300 mg total) by mouth 3 (three) times daily. 270 capsule 1  . oxyCODONE (ROXICODONE) 15 MG immediate release tablet Take 15 mg by mouth every 6 (six) hours as needed.       Musculoskeletal: Strength & Muscle Tone: within normal limits Gait & Station: normal Patient leans: N/A  Psychiatric Specialty Exam: Physical Exam  Nursing note and vitals reviewed. Constitutional: He appears well-developed and  well-nourished.  HENT:  Head: Normocephalic and atraumatic.  Eyes: Conjunctivae are normal. Pupils are equal, round, and reactive to light.  Neck: Normal range of motion.  Cardiovascular: Regular rhythm and normal heart sounds.   Respiratory: Effort normal. No respiratory distress.  GI: Soft.  Musculoskeletal: Normal range of motion.  Neurological: He is alert.  Skin: Skin  is warm and dry.  Psychiatric: He has a normal mood and affect. His speech is normal and behavior is normal. Judgment and thought content normal. Cognition and memory are normal.    Review of Systems  Constitutional: Negative.   HENT: Negative.   Eyes: Negative.   Respiratory: Negative.   Cardiovascular: Negative.   Gastrointestinal: Negative.   Musculoskeletal: Positive for back pain and joint pain.  Skin: Negative.   Neurological: Negative.   Psychiatric/Behavioral: Positive for depression. Negative for hallucinations, substance abuse and suicidal ideas. The patient is nervous/anxious.     Blood pressure (!) 146/96, pulse 67, temperature 98.5 F (36.9 C), temperature source Oral, resp. rate 18, height 6' 1"  (1.854 m), weight 99.8 kg (220 lb), SpO2 98 %.Body mass index is 29.03 kg/m.  General Appearance: Fairly Groomed  Eye Contact:  Fair  Speech:  Normal Rate  Volume:  Normal  Mood:  Euthymic  Affect:  Congruent  Thought Process:  Goal Directed  Orientation:  Full (Time, Place, and Person)  Thought Content:  Logical  Suicidal Thoughts:  No  Homicidal Thoughts:  No  Memory:  Immediate;   Fair Recent;   Fair Remote;   Fair  Judgement:  Fair  Insight:  Fair  Psychomotor Activity:  Normal  Concentration:  Concentration: Fair  Recall:  [employer] of Knowledge:  Fair  Language:  Fair  Akathisia:  No  Handed:  Right  AIMS (if indicated):     Assets:  Communication Skills Desire for Improvement Financial Resources/Insurance Housing Resilience  ADL's:  Intact  Cognition:  WNL  Sleep:         Treatment Plan Summary: Plan 61 year old man with a history of chronic anxiety possibly bipolar-type disorder but who came into the hospital saying he was feeling distraught and overwhelmed about his back pain. He now says that he is feeling much better. Denies any suicidal thoughts at all. He did say that he thought Vistaril that he had received here in the emergency room helped a great deal with his anxiety and back pain. I suggested I could give him a prescription of it but he prefers not to because he thinks it is going to make his heart beat go fast which has been a side effect from other medicines in the past. He will continue with his current medications and will follow-up with his outpatient psychiatrist. Discontinue IVC. Case reviewed with emergency room doctor and social work and nursing.  Disposition: Patient does not meet criteria for psychiatric inpatient admission. Supportive therapy provided about ongoing stressors.  Alethia Berthold, MD 04/11/2016 1:49 PM

## 2016-04-11 NOTE — ED Notes (Signed)
Patient complaining of 10 out of 10 pain in his back going down to his legs from slipped disk in back pressing on a nerve. PRN medications to be given.

## 2016-04-11 NOTE — ED Provider Notes (Signed)
  Physical Exam  BP (!) 146/96 (BP Location: Left Arm)   Pulse 67   Temp 98.5 F (36.9 C) (Oral)   Resp 18   Ht 6\' 1"  (1.854 m)   Wt 220 lb (99.8 kg)   SpO2 98%   BMI 29.03 kg/m   Physical Exam  ED Course  Procedures  MDM Patient has vague suicidal ideation after running out of xanax, pain meds. Given xanax, pain meds in the ED. Was under IVC but Dr. Weber Cooks took him off IVC. Will dc home. He will need refill from PCP.     Drenda Freeze, MD 04/11/16 1343

## 2016-04-11 NOTE — Progress Notes (Signed)
LCSW and Dr Weber Cooks consulted. Patient will be discharged home. Patient will follow up with his personal care physician and Dr Gretel Acre.Patient reports he is not suicidal or homicidal he has just been experiencing chronic pain.  No further needs   [employer] [phone]

## 2016-04-11 NOTE — Discharge Instructions (Signed)
You need to get your xanax and percocet refilled by your doctor  See psychiatrist  Return to ER if you have thoughts to harm yourself or others, hallucinations.

## 2016-04-11 NOTE — ED Notes (Signed)
Pt told RN the medication (Vistaril) he was given has helped him greatly "with all of it."  No concerns voiced. Maintained on 15 minute checks and observation by security camera for safety.

## 2016-04-11 NOTE — ED Provider Notes (Signed)
-----------------------------------------   7:53 AM on 04/11/2016 -----------------------------------------   Blood pressure (!) 164/95, pulse 71, temperature 97.9 F (36.6 C), temperature source Oral, resp. rate 18, height 6\' 1"  (1.854 m), weight 220 lb (99.8 kg), SpO2 95 %.  The patient had no acute events since last update.  Calm and cooperative at this time.  [facility] and the patient is on the wait list     Loney Hering, MD 04/11/16 [phone]

## 2016-04-11 NOTE — ED Notes (Signed)
Pt discharged to lobby, cousin will give him a ride home. All belongings returned to pt upon discharge.

## 2016-04-11 NOTE — ED Notes (Signed)
Pt dressing for discharge. Pt's cousin is waiting in lobby to bring him home. All belongings will be returned to pt upon discharge. Maintained on 15 minute checks and observation by security camera for safety.

## 2016-04-11 NOTE — ED Notes (Signed)
Pt informed he will be discharged today. Pt given phone to call for a ride. Pt accepting. Maintained on 15 minute checks and observation by security camera for safety.

## 2016-04-11 NOTE — Progress Notes (Signed)
LCSW reviewed patient notes and this patient is going to [facility] in patient- NOT [place], Consulted with Dr Weber Cooks who will assess patient needs.  [employer] LCSW [phone]

## 2016-04-21 ENCOUNTER — Other Ambulatory Visit (HOSPITAL_COMMUNITY): Payer: Self-pay | Admitting: Nurse Practitioner

## 2016-04-21 DIAGNOSIS — R200 Anesthesia of skin: Secondary | ICD-10-CM

## 2016-04-21 DIAGNOSIS — M5430 Sciatica, unspecified side: Secondary | ICD-10-CM

## 2016-04-21 DIAGNOSIS — M545 Low back pain: Secondary | ICD-10-CM

## 2016-04-21 DIAGNOSIS — R202 Paresthesia of skin: Secondary | ICD-10-CM

## 2016-04-25 ENCOUNTER — Other Ambulatory Visit: Payer: Self-pay | Admitting: Nurse Practitioner

## 2016-04-25 ENCOUNTER — Other Ambulatory Visit (HOSPITAL_COMMUNITY): Payer: Self-pay | Admitting: Nurse Practitioner

## 2016-04-25 DIAGNOSIS — M545 Low back pain: Secondary | ICD-10-CM

## 2016-04-25 DIAGNOSIS — R202 Paresthesia of skin: Secondary | ICD-10-CM

## 2016-04-25 DIAGNOSIS — M5430 Sciatica, unspecified side: Secondary | ICD-10-CM

## 2016-04-25 DIAGNOSIS — R200 Anesthesia of skin: Secondary | ICD-10-CM

## 2016-05-01 ENCOUNTER — Ambulatory Visit
Admission: RE | Admit: 2016-05-01 | Discharge: 2016-05-01 | Disposition: A | Discharge status: Home / Self Care | Payer: Medicare Other | Source: Ambulatory Visit | Attending: Nurse Practitioner | Admitting: Nurse Practitioner

## 2016-05-01 DIAGNOSIS — M5440 Lumbago with sciatica, unspecified side: Secondary | ICD-10-CM | POA: Diagnosis not present

## 2016-05-01 DIAGNOSIS — M545 Low back pain: Secondary | ICD-10-CM | POA: Diagnosis present

## 2016-05-01 DIAGNOSIS — R202 Paresthesia of skin: Secondary | ICD-10-CM | POA: Insufficient documentation

## 2016-05-01 DIAGNOSIS — R200 Anesthesia of skin: Secondary | ICD-10-CM | POA: Insufficient documentation

## 2016-05-01 DIAGNOSIS — M5430 Sciatica, unspecified side: Secondary | ICD-10-CM

## 2016-05-02 ENCOUNTER — Ambulatory Visit: Payer: Medicare Other

## 2016-05-07 ENCOUNTER — Ambulatory Visit: Payer: Medicare Other | Admitting: Psychiatry

## 2016-05-08 ENCOUNTER — Ambulatory Visit: Payer: Medicare Other

## 2016-05-22 ENCOUNTER — Other Ambulatory Visit: Payer: Self-pay | Admitting: Psychiatry

## 2016-06-02 ENCOUNTER — Ambulatory Visit: Payer: Medicare Other | Admitting: Psychiatry

## 2016-06-22 DEATH — deceased

## 2017-06-07 IMAGING — CR DG CHEST 2V
1 series · 2 of 2 positions shown · non-contrast
Comparison: Chest radiograph performed 11/11/2010

CLINICAL DATA: Acute onset of left-sided chest pain. Initial
encounter.

EXAM:
CHEST  2 VIEW

[Series 1: dg chest 2 view · 0.14mm/px · 2 of 2 slices shown]
[im 1/2]
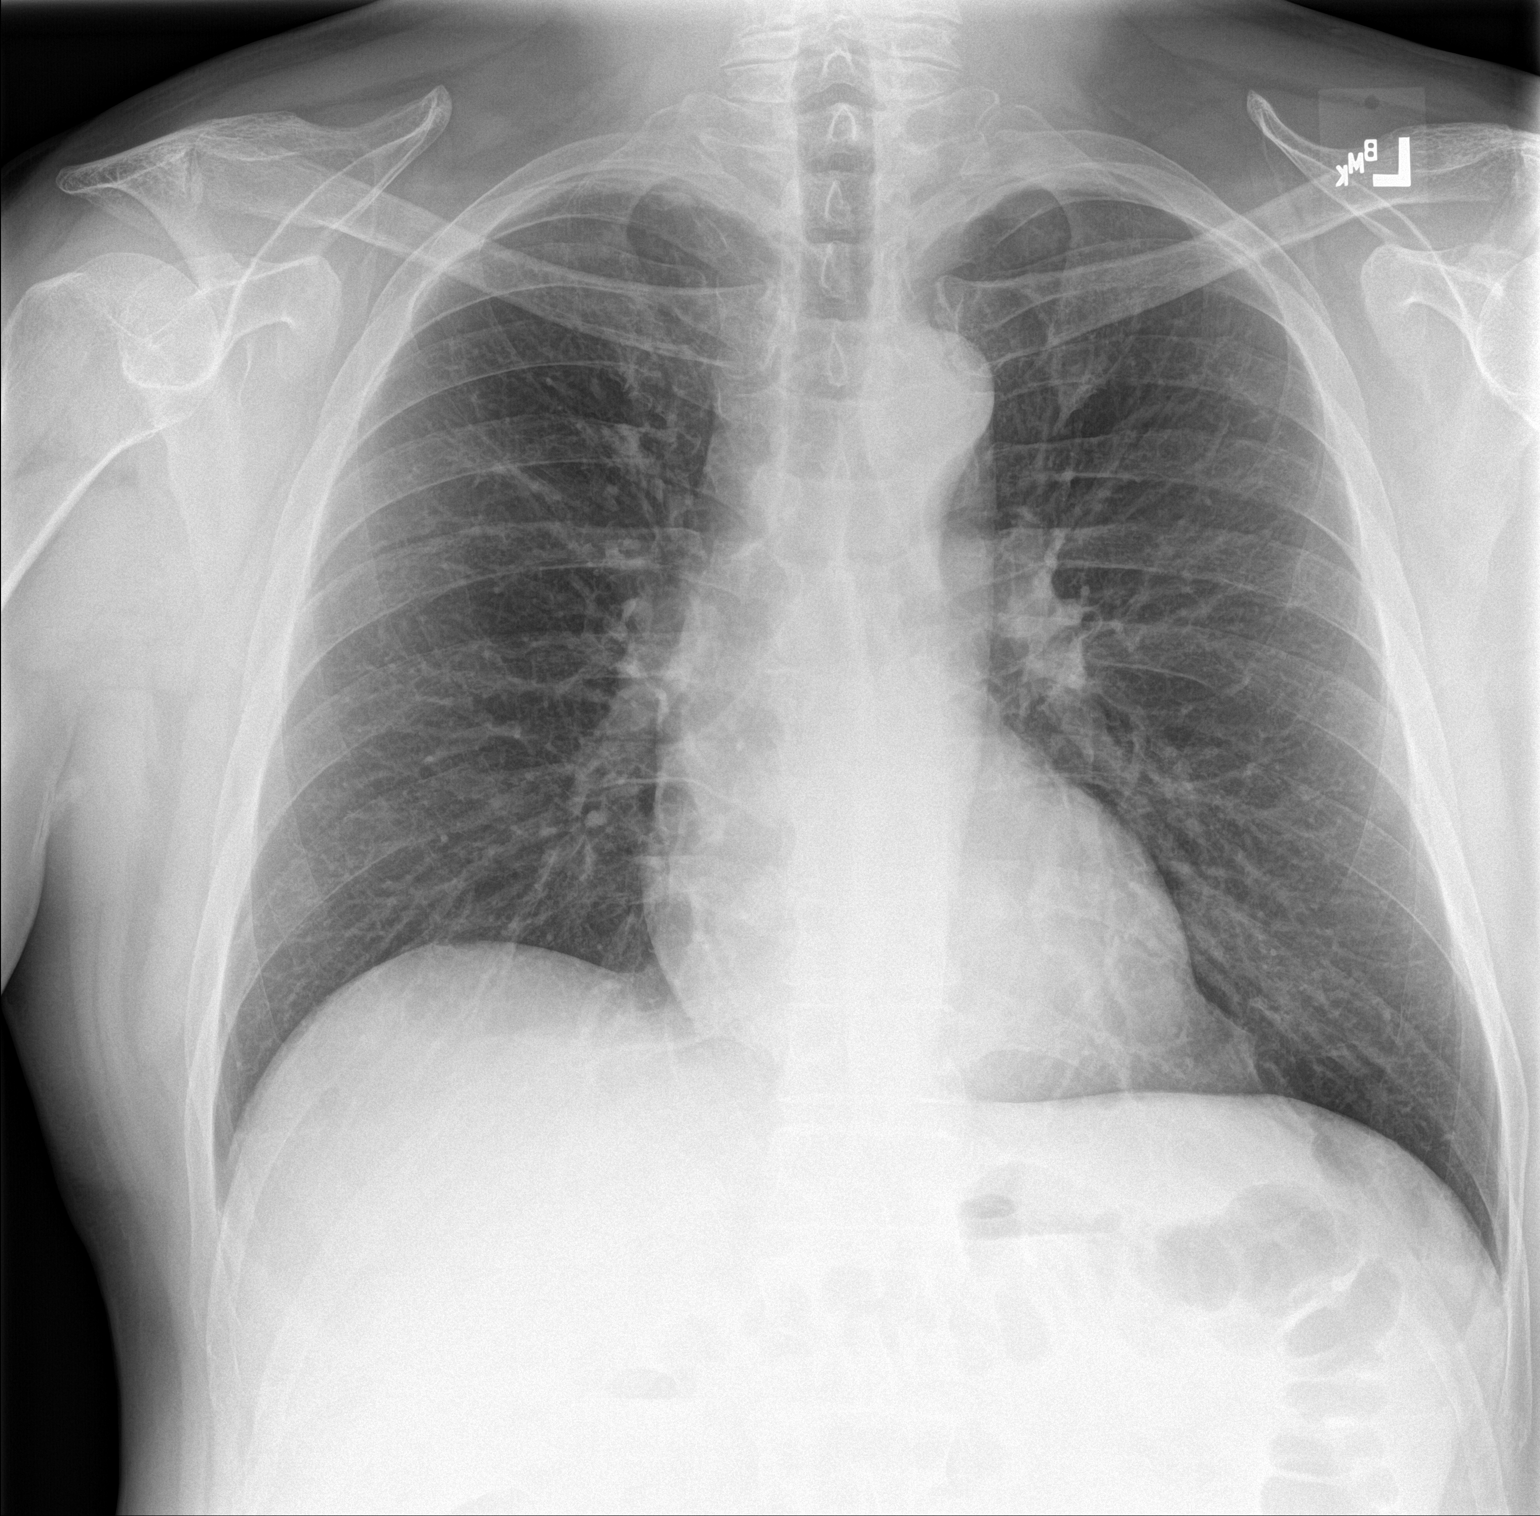
[im 2/2]
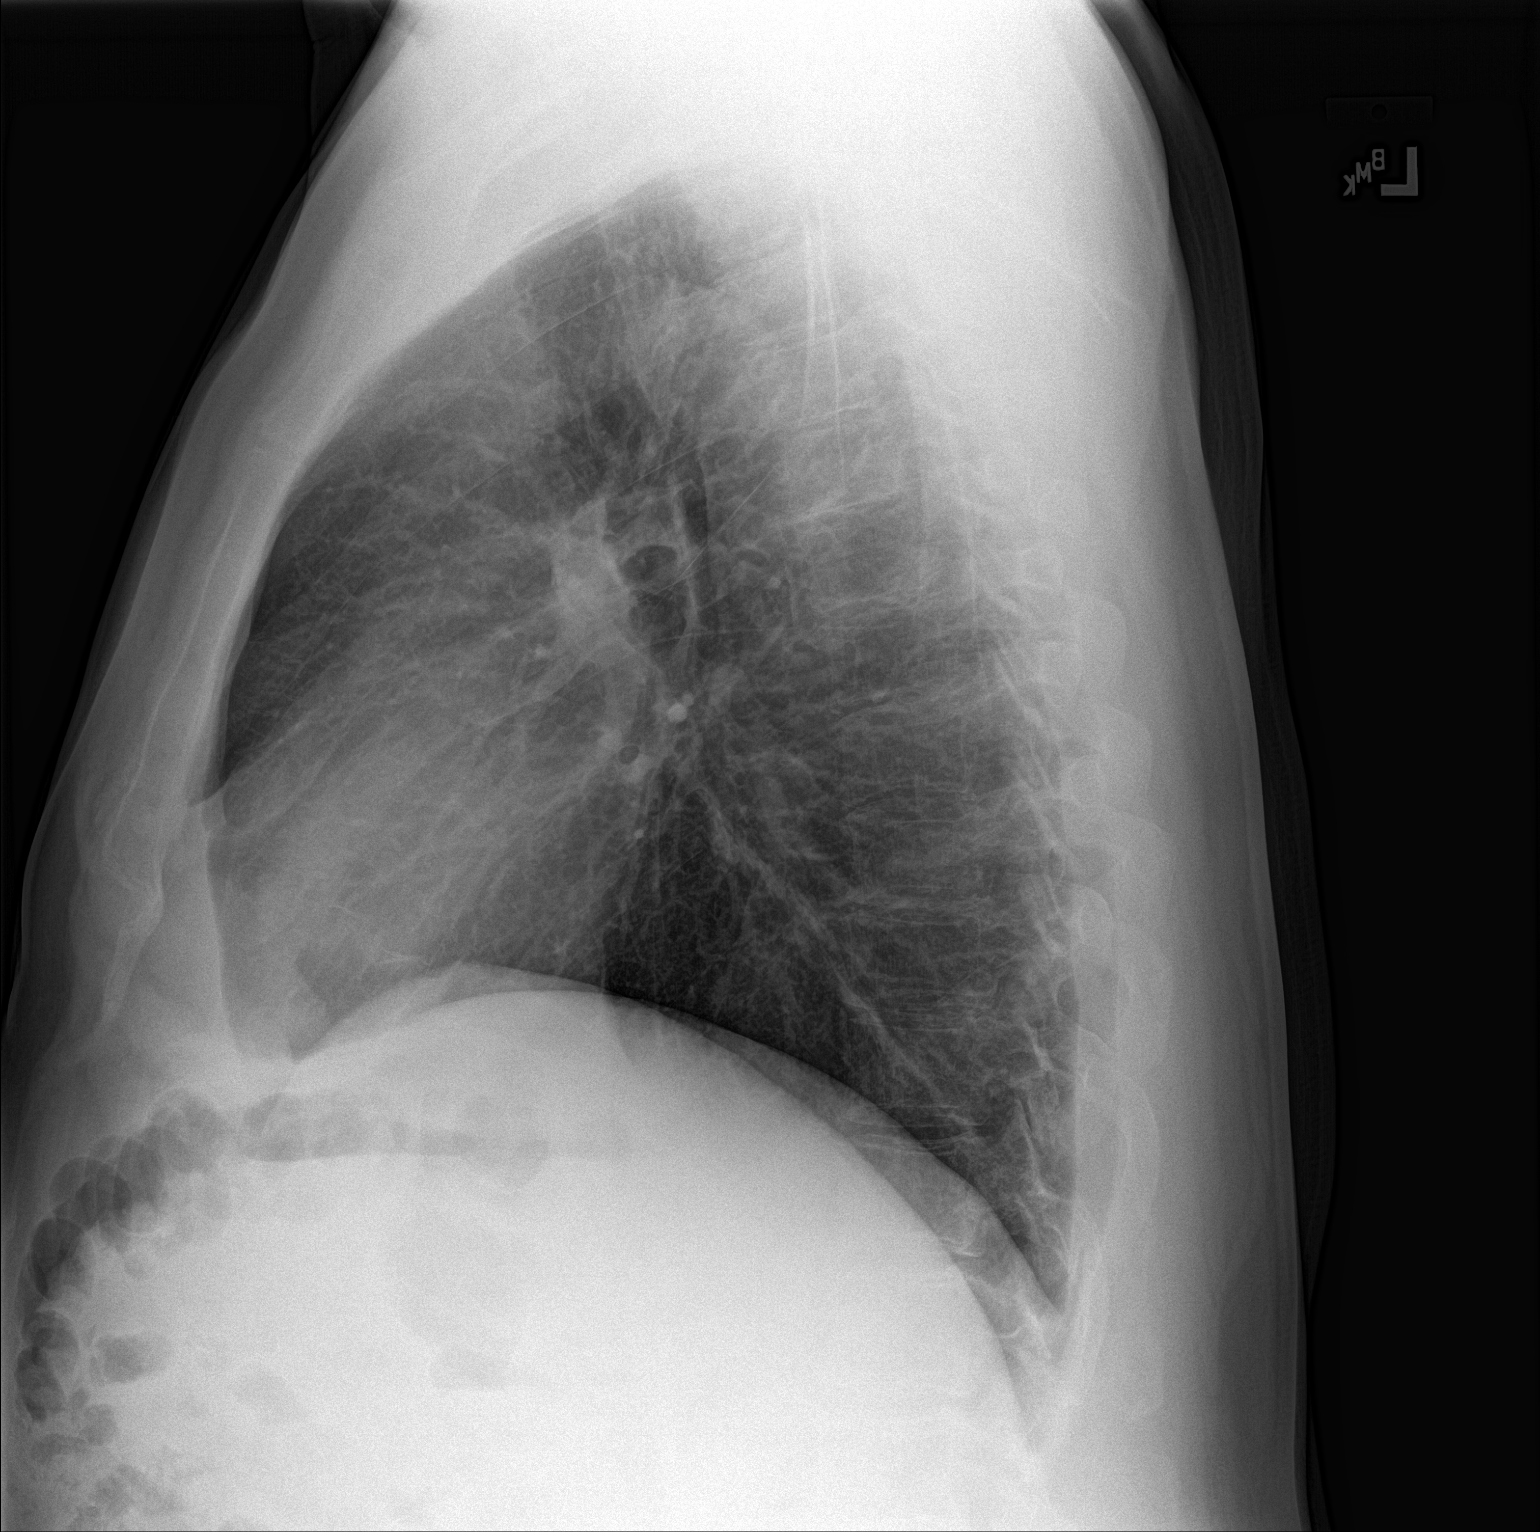

[2 of 2 positions shown; findings below may reference images not displayed]

FINDINGS: The lungs are well-aerated and clear. There is no evidence of focal
opacification, pleural effusion or pneumothorax.

The heart is normal in size; the mediastinal contour is within
normal limits. No acute osseous abnormalities are seen.
IMPRESSION: No acute cardiopulmonary process seen.

## 2017-06-21 IMAGING — US US RENAL
1 series · 14 of 20 positions shown · non-contrast
Comparison: Renal ultrasound January 20, 2014

CLINICAL DATA: History of right nephrectomy for malignancy,
crystals detected in the urine several weeks ago, no pain or
hematuria

EXAM:
RENAL / URINARY TRACT ULTRASOUND COMPLETE

[Series 1: us renal · 0.26mm/px · 14 of 20 slices shown]
[im 1/20]
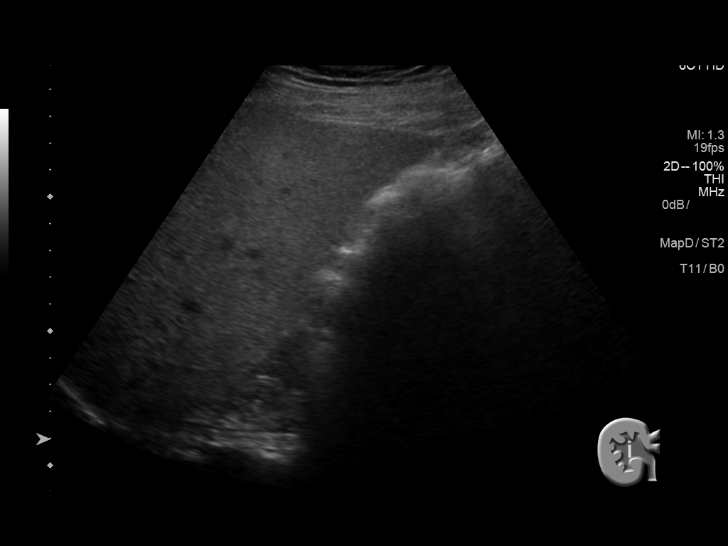
[im 3/20]
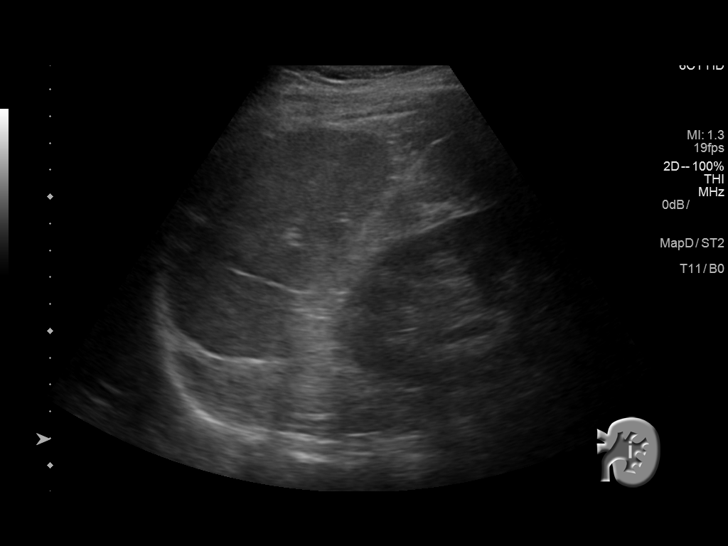
[im 4/20]
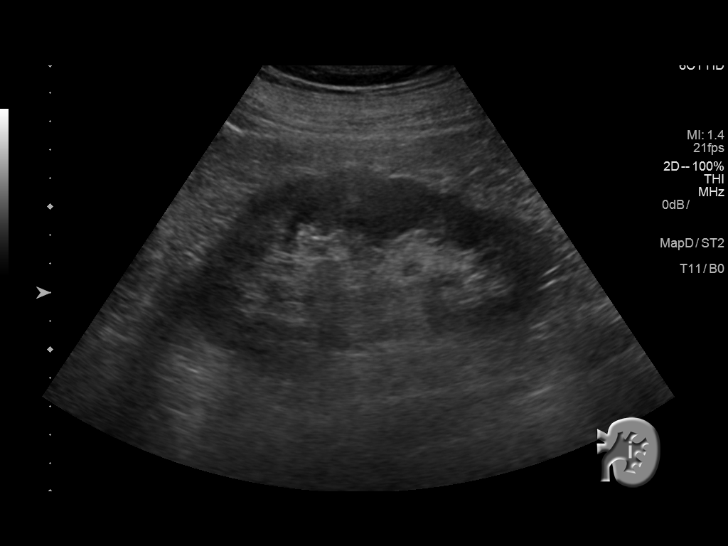
[im 6/20]
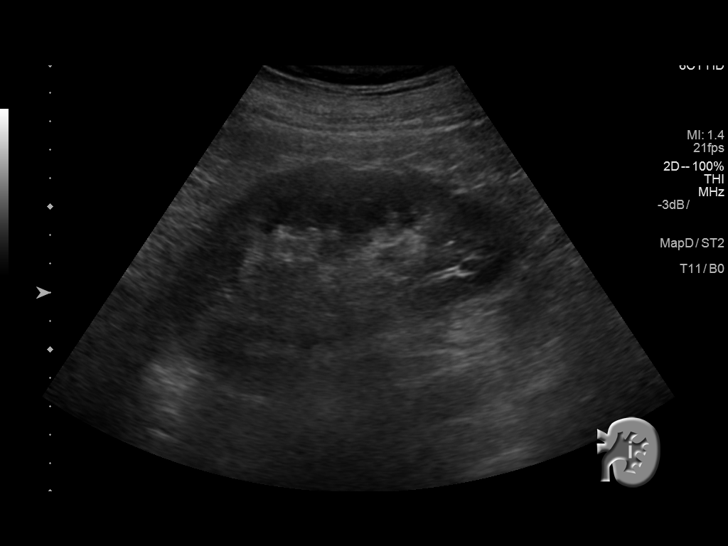
[im 7/20]
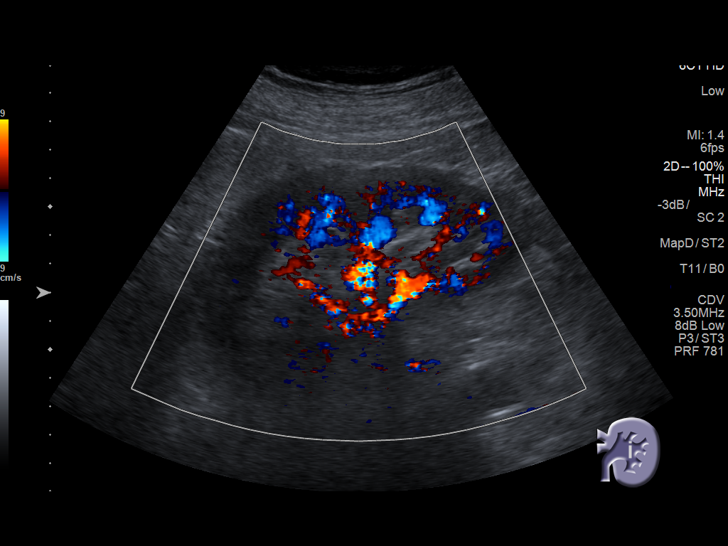
[im 8/20]
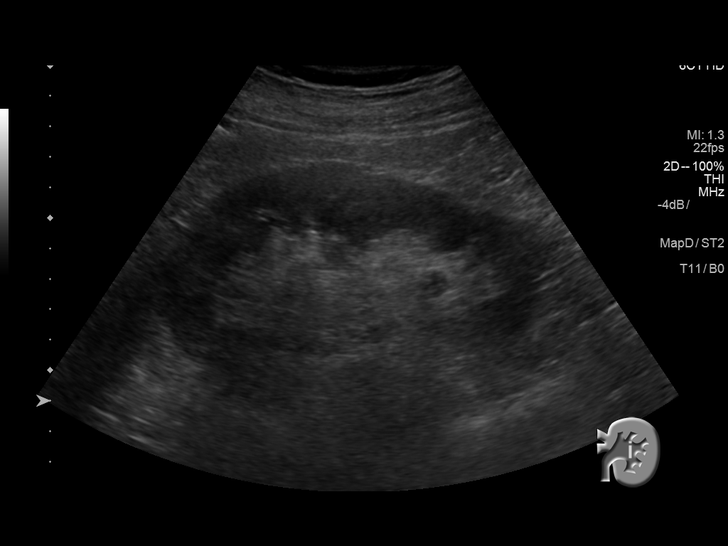
[im 10/20]
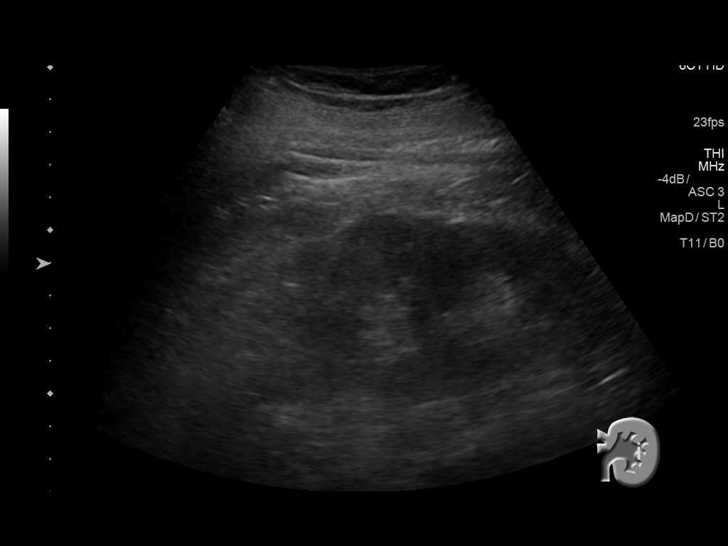
[im 11/20]
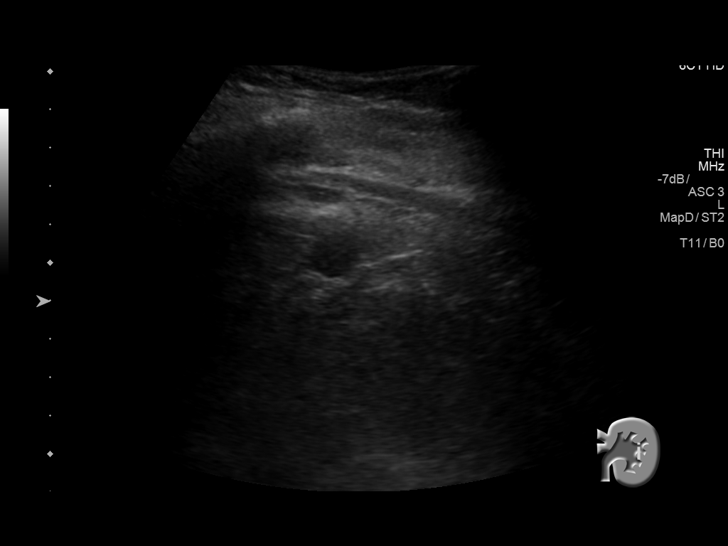
[im 13/20]
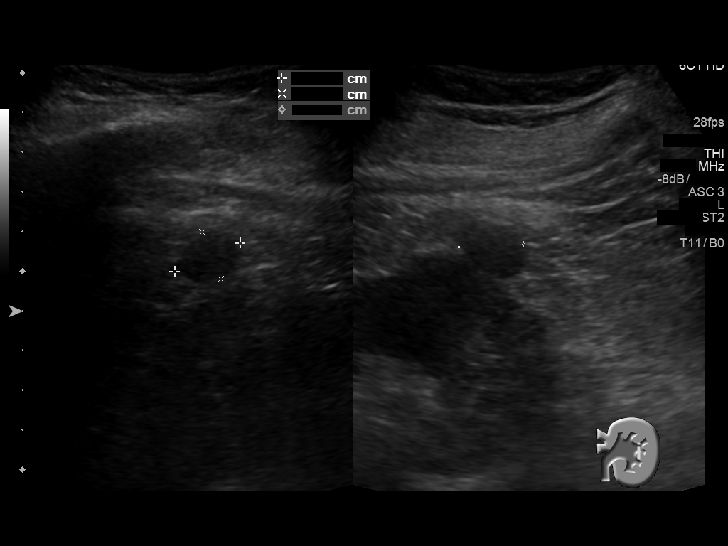
[im 14/20]
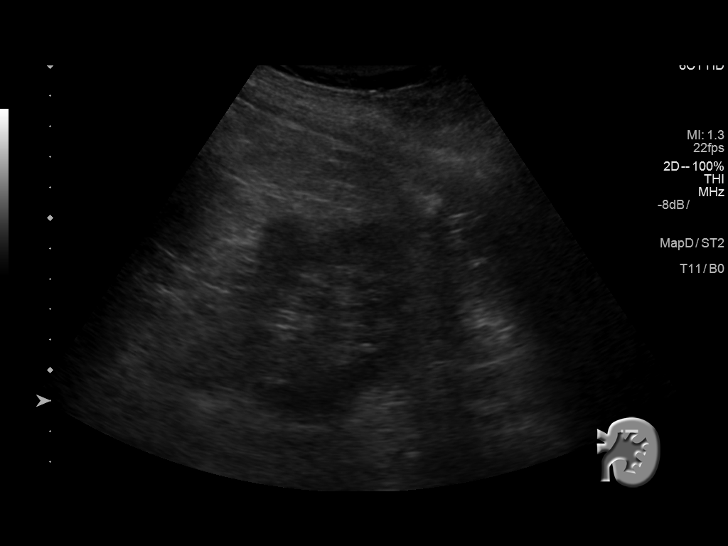
[im 16/20]
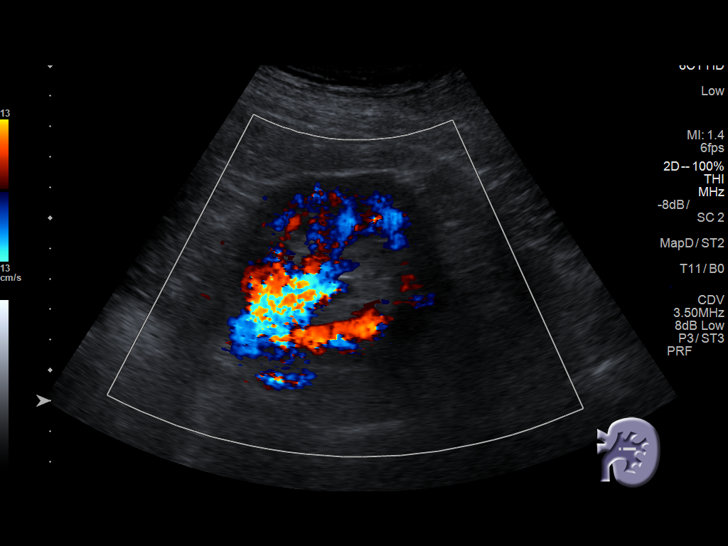
[im 17/20]
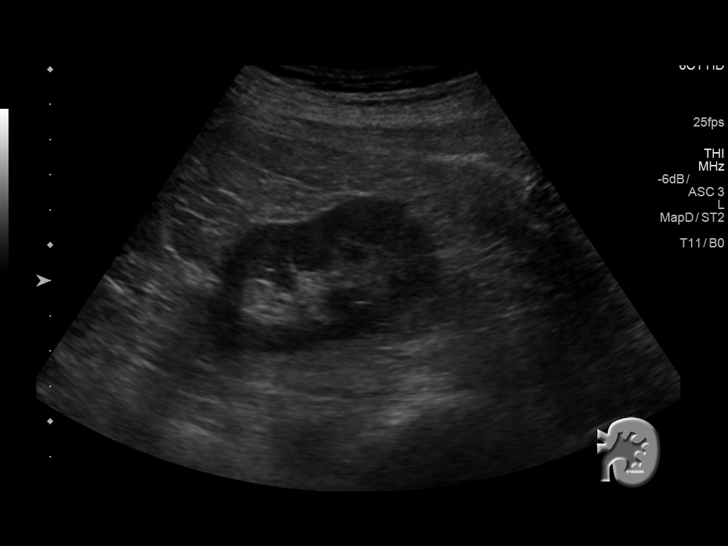
[im 18/20]
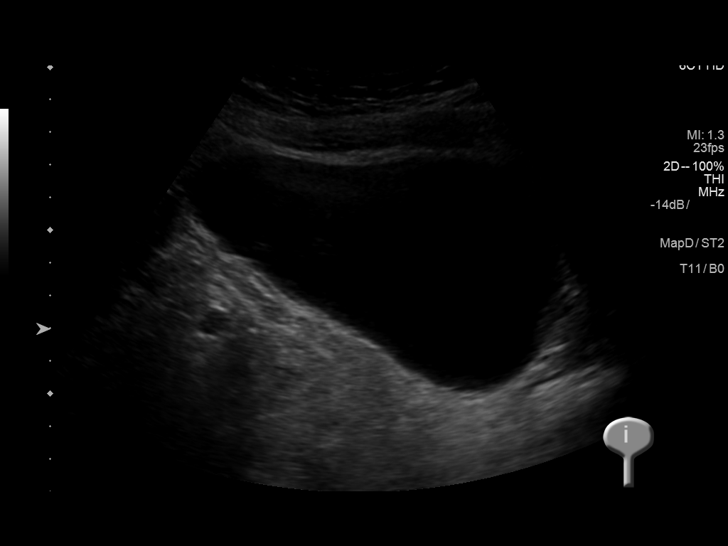
[im 20/20]
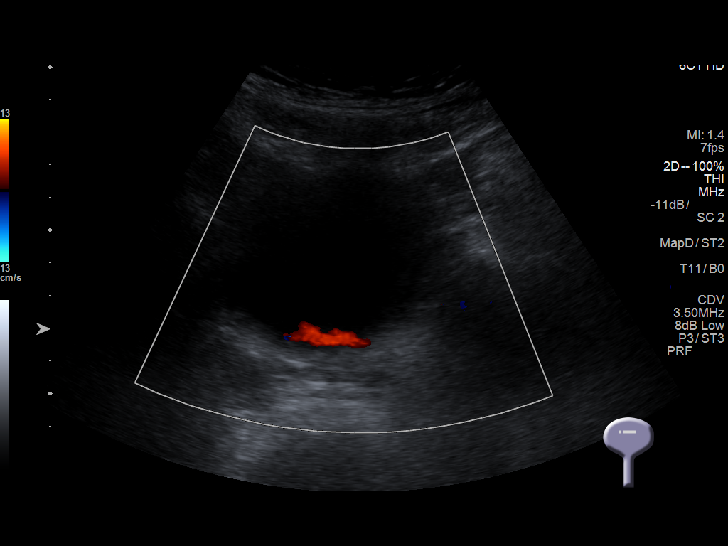

[14 of 20 positions shown; findings below may reference images not displayed]

FINDINGS: Right Kidney:

The right kidney is surgically absent

Left Kidney:

Length: 13.2 cm. Echogenicity within normal limits. No
hydronephrosis visualized. There is a lateral mid to lower pole
cortical cyst measuring 1.8 x 1.3 x 1.6 cm. This has been previously
demonstrated. No calcified shadowing stones are observed.

Bladder:

Appears normal for degree of bladder distention. The left ureteral
jet was demonstrated.
IMPRESSION: Stable appearance of the left kidney without evidence of calcified
stones nor hydronephrosis. The right kidney is surgically absent.
